# Patient Record
Sex: Female | Born: 1965 | Race: Black or African American | Hispanic: No | Marital: Married | Smoking: Current some day smoker
Health system: Southern US, Community
[De-identification: ages and names within clinical notes are randomized; demographics above are authoritative.]

## PROBLEM LIST (undated history)

## (undated) DIAGNOSIS — R569 Unspecified convulsions: Secondary | ICD-10-CM

## (undated) DIAGNOSIS — G5600 Carpal tunnel syndrome, unspecified upper limb: Secondary | ICD-10-CM

## (undated) HISTORY — PX: TUBAL LIGATION: SHX77

---

## 2004-02-01 ENCOUNTER — Emergency Department (HOSPITAL_COMMUNITY): Admission: EM | Admit: 2004-02-01 | Discharge: 2004-02-01 | Payer: Self-pay | Admitting: Emergency Medicine

## 2010-01-09 ENCOUNTER — Emergency Department (HOSPITAL_COMMUNITY): Admission: EM | Admit: 2010-01-09 | Discharge: 2010-01-09 | Payer: Self-pay | Admitting: Emergency Medicine

## 2011-07-04 ENCOUNTER — Emergency Department (HOSPITAL_COMMUNITY)
Admission: EM | Admit: 2011-07-04 | Discharge: 2011-07-04 | Disposition: A | Discharge status: Home / Self Care | Payer: Self-pay | Attending: Emergency Medicine | Admitting: Emergency Medicine

## 2011-07-04 ENCOUNTER — Encounter: Payer: Self-pay | Admitting: Emergency Medicine

## 2011-07-04 ENCOUNTER — Emergency Department (HOSPITAL_COMMUNITY): Payer: Self-pay

## 2011-07-04 DIAGNOSIS — M79643 Pain in unspecified hand: Secondary | ICD-10-CM

## 2011-07-04 DIAGNOSIS — M79609 Pain in unspecified limb: Secondary | ICD-10-CM | POA: Insufficient documentation

## 2011-07-04 MED ORDER — NAPROXEN 500 MG PO TABS: 500.0000 mg | ORAL_TABLET | Freq: Two times a day (BID) | ORAL | Status: AC

## 2011-07-04 NOTE — ED Notes (Signed)
C/o onset of left hand pain last p.m.  No injury and no obvious deformity.  Pain is along thumb, 2nd and 3rd digits and increases with any use of hand.

## 2011-07-04 NOTE — ED Provider Notes (Signed)
History   Scribed for Benny Lennert, MD, the patient was seen in room APA06/APA06. This chart was scribed by Clarita Crane. This patient's care was started at 8:36AM.   CSN: [id number] Arrival date & time: 07/04/2011  8:02 AM  Chief Complaint  Patient presents with  . Hand Pain   HPI Cheryl Peters is a 45 y.o. female who presents to the Emergency Department complaining of constant left hand pain localized to thenar eminence described as soreness with associated swelling onset yesterday and persistent since. Notes hand pain is aggravated with movement of left wrist and left hand. Patient denies repetitive use of hands. Patient is left hand dominant. Denies numbness, tingling, HA, fever.   HPI ELEMENTS: Location: left hand-thenar eminence  Onset: yesterday Duration: persistent since onset  Timing: constant  Quality: soreness  Modifying factors: movement of left wrist and left hand  Context:  as above  Associated symptoms: +swelling. Denies fever, HA, numbness, tingling.   PAST MEDICAL HISTORY:  History reviewed. No pertinent past medical history.  PAST SURGICAL HISTORY:  Past Surgical History  Procedure Date  . Tubal ligation     MEDICATIONS:  Previous Medications   ACETAMINOPHEN (TYLENOL) 500 MG TABLET    Take 1,000 mg by mouth every 6 (six) hours as needed. For pain      ALLERGIES:  Allergies as of 07/04/2011  . (No Known Allergies)     FAMILY HISTORY:  Family History  Problem Relation Age of Onset  . Hypertension Mother   . Hypertension Sister      SOCIAL HISTORY: History   Social History  . Marital Status: Single    Spouse Name: N/A    Number of Children: N/A  . Years of Education: N/A   Social History Main Topics  . Smoking status: Former Games developer  . Smokeless tobacco: None  . Alcohol Use: No  . Drug Use: No  . Sexually Active:    Other Topics Concern  . None   Social History Narrative  . None    OB History    Grav Para Term Preterm Abortions TAB  SAB Ect Mult Living   2 2 2       2      Review of Systems  Constitutional: Negative for fatigue.  HENT: Negative for neck pain and sinus pressure.   Eyes: Negative for visual disturbance.  Respiratory: Negative for cough and shortness of breath.   Cardiovascular: Negative for chest pain.  Gastrointestinal: Negative for abdominal pain.  Musculoskeletal: Negative for back pain.       Left hand pain. Left hand swelling.   Skin: Negative for rash.  Neurological: Negative for headaches.  Hematological: Negative.     Physical Exam  BP 157/83  Pulse 99  Temp 98 F (36.7 C)  Resp 20  Ht 5\' 4"  (1.626 m)  Wt 172 lb 1 oz (78.047 kg)  BMI 29.53 kg/m2  SpO2 98%  LMP 05/29/2011  Physical Exam  Nursing note and vitals reviewed. Constitutional: She is oriented to person, place, and time. She appears well-developed and well-nourished. No distress.  HENT:  Head: Normocephalic.  Eyes: EOM are normal.  Neck: Neck supple. No tracheal deviation present.  Cardiovascular: Normal rate.   Pulmonary/Chest: Effort normal.  Abdominal: She exhibits no distension.  Musculoskeletal: Normal range of motion.       Mild tenderness to left thenar eminence with moderate swelling.  Neurological: She is alert and oriented to person, place, and time.  Skin: Skin  is warm and dry.  Psychiatric: She has a normal mood and affect. Her behavior is normal.    ED Course  Procedures  OTHER DATA REVIEWED: Nursing notes, vital signs, and past medical records reviewed. Lab results reviewed and considered Imaging results reviewed and considered  DIAGNOSTIC STUDIES: Oxygen Saturation is 98% on room air, normal by my interpretation.    LABS / RADIOLOGY: Dg Hand Complete Left  07/04/2011  *RADIOLOGY REPORT*  Clinical Data: Pain and swelling  LEFT HAND - COMPLETE 3+ VIEW  Comparison: None.  Findings: Normal alignment.  No fracture.  Preserved joint spaces. No radiographic foreign body.  IMPRESSION: No acute  finding  Original Report Authenticated By: Judie Petit. Ruel Favors, M.D.   ED COURSE / COORDINATION OF CARE: Orders Placed This Encounter  Procedures  . DG Hand Complete Left     MDM            Carpal tunnel.  Thenar eminence inflamed   PLAN: Discharge Home The patient is to return the emergency department if there is any worsening of symptoms. I have reviewed the discharge instructions with the patient/family  CONDITION ON DISCHARGE: Stable   MEDICATIONS GIVEN IN THE E.D.  Medications  acetaminophen (TYLENOL) 500 MG tablet (not administered)  naproxen (NAPROSYN) 500 MG tablet (not administered)   The chart was scribed for me under my direct supervision.  I personally performed the history, physical, and medical decision making and all procedures in the evaluation of this patient.Benny Lennert, MD 07/04/11 [phone]

## 2011-07-04 NOTE — ED Notes (Signed)
Pt c/o l hand pain/swelling since yesterday, denies trauma.

## 2012-05-21 ENCOUNTER — Encounter (HOSPITAL_COMMUNITY): Payer: Self-pay | Admitting: *Deleted

## 2012-05-21 ENCOUNTER — Emergency Department (HOSPITAL_COMMUNITY)
Admission: EM | Admit: 2012-05-21 | Discharge: 2012-05-21 | Disposition: A | Discharge status: Home / Self Care | Payer: Self-pay | Attending: Emergency Medicine | Admitting: Emergency Medicine

## 2012-05-21 DIAGNOSIS — W57XXXA Bitten or stung by nonvenomous insect and other nonvenomous arthropods, initial encounter: Secondary | ICD-10-CM

## 2012-05-21 DIAGNOSIS — F17200 Nicotine dependence, unspecified, uncomplicated: Secondary | ICD-10-CM | POA: Insufficient documentation

## 2012-05-21 DIAGNOSIS — S90569A Insect bite (nonvenomous), unspecified ankle, initial encounter: Secondary | ICD-10-CM | POA: Insufficient documentation

## 2012-05-21 MED ORDER — PREDNISONE 20 MG PO TABS
60.0000 mg | ORAL_TABLET | Freq: Once | ORAL | Status: AC
  Administered 2012-05-21: 60 mg via ORAL
  Filled 2012-05-21: qty 1

## 2012-05-21 MED ORDER — FAMOTIDINE 20 MG PO TABS
20.0000 mg | ORAL_TABLET | Freq: Once | ORAL | Status: AC
  Administered 2012-05-21: 20 mg via ORAL
  Filled 2012-05-21: qty 1

## 2012-05-21 MED ORDER — PREDNISONE 50 MG PO TABS: ORAL_TABLET | ORAL | Status: AC

## 2012-05-21 MED ORDER — DIPHENHYDRAMINE HCL 25 MG PO CAPS
50.0000 mg | ORAL_CAPSULE | Freq: Once | ORAL | Status: AC
  Administered 2012-05-21: 50 mg via ORAL
  Filled 2012-05-21 (×2): qty 1

## 2012-05-21 NOTE — ED Provider Notes (Signed)
History     CSN: [id number]  Arrival date & time 05/21/12  1253   First MD Initiated Contact with Patient 05/21/12 1303      Chief Complaint  Patient presents with  . Rash    (Consider location/radiation/quality/duration/timing/severity/associated sxs/prior treatment) HPI Comments: Pt states she has been outside a lot lately.  Yesterday she noted that she was itching from her waist down and saw multiple red spots in same area.  No fever or chills.  The history is provided by the patient. No language interpreter was used.    History reviewed. No pertinent past medical history.  Past Surgical History  Procedure Date  . Tubal ligation     Family History  Problem Relation Age of Onset  . Hypertension Mother   . Hypertension Sister     History  Substance Use Topics  . Smoking status: Current Some Day Smoker  . Smokeless tobacco: Not on file  . Alcohol Use: No    OB History    Grav Para Term Preterm Abortions TAB SAB Ect Mult Living   2 2 2       2       Review of Systems  Constitutional: Negative for fever and chills.  Skin:       Skin lesions   All other systems reviewed and are negative.    Allergies  Review of patient's allergies indicates no known allergies.  Home Medications   Current Outpatient Rx  Name Route Sig Dispense Refill  . ACETAMINOPHEN 500 MG PO TABS Oral Take 1,000 mg by mouth every 6 (six) hours as needed. For pain     . NAPROXEN 500 MG PO TABS Oral Take 1 tablet (500 mg total) by mouth 2 (two) times daily. 30 tablet 0    BP 115/68  Pulse 87  Temp 97.7 F (36.5 C) (Oral)  Resp 17  Wt 189 lb (85.73 kg)  SpO2 98%  LMP 05/29/2011  Physical Exam  Nursing note and vitals reviewed. Constitutional: She is oriented to person, place, and time. She appears well-developed and well-nourished. No distress.  HENT:  Head: Normocephalic and atraumatic.  Eyes: EOM are normal.  Neck: Normal range of motion.  Cardiovascular: Normal rate,  regular rhythm and normal heart sounds.   Pulmonary/Chest: Effort normal and breath sounds normal.  Abdominal: Soft. She exhibits no distension. There is no tenderness.  Musculoskeletal: Normal range of motion.       ~ 50-60 distinct red lesions ~ 3-4 mm in diam scattered from waist to mid calves c/w insect bites/?chiggers.  Neurological: She is alert and oriented to person, place, and time.  Skin: Skin is warm and dry. No rash noted. There is erythema.  Psychiatric: She has a normal mood and affect. Judgment normal.    ED Course  Procedures (including critical care time)  Labs Reviewed - No data to display No results found.   1. Insect bites       MDM  rx-prednisone 50 mg, 6 Benadryl 50 mg QID pepcid 20 mg BID        Evalina Field, [place] 05/21/12 1402

## 2012-05-21 NOTE — ED Notes (Signed)
Itching rash to to thighs , arms and trunk since yesterday. Alert, no resp distress.

## 2012-05-21 NOTE — ED Notes (Signed)
Patient with no complaints at this time. Respirations even and unlabored. Skin warm/dry. Discharge instructions reviewed with patient at this time. Patient given opportunity to voice concerns/ask questions. Patient discharged at this time and left Emergency Department with steady gait.   

## 2012-05-22 NOTE — ED Provider Notes (Signed)
Medical screening examination/treatment/procedure(s) were performed by non-physician practitioner and as supervising physician I was immediately available for consultation/collaboration.  Massiah Minjares R. Kandis Henry, MD 05/22/12 1457 

## 2012-05-27 ENCOUNTER — Emergency Department (HOSPITAL_COMMUNITY)
Admission: EM | Admit: 2012-05-27 | Discharge: 2012-05-28 | Disposition: A | Discharge status: Home / Self Care | Payer: Self-pay | Attending: Emergency Medicine | Admitting: Emergency Medicine

## 2012-05-27 ENCOUNTER — Encounter (HOSPITAL_COMMUNITY): Payer: Self-pay

## 2012-05-27 DIAGNOSIS — F17200 Nicotine dependence, unspecified, uncomplicated: Secondary | ICD-10-CM | POA: Insufficient documentation

## 2012-05-27 DIAGNOSIS — R5381 Other malaise: Secondary | ICD-10-CM | POA: Insufficient documentation

## 2012-05-27 DIAGNOSIS — R569 Unspecified convulsions: Secondary | ICD-10-CM

## 2012-05-27 DIAGNOSIS — R5383 Other fatigue: Secondary | ICD-10-CM | POA: Insufficient documentation

## 2012-05-27 DIAGNOSIS — N390 Urinary tract infection, site not specified: Secondary | ICD-10-CM | POA: Insufficient documentation

## 2012-05-27 NOTE — ED Notes (Signed)
Patient states that she is feeling weak. House is hot, no air conditioning.

## 2012-05-28 LAB — CBC
HCT: 37.2 % (ref 36.0–46.0)
MCV: 83.8 fL (ref 78.0–100.0)
RBC: 4.44 MIL/uL (ref 3.87–5.11)
RDW: 15.3 % (ref 11.5–15.5)
WBC: 11.4 10*3/uL — ABNORMAL HIGH (ref 4.0–10.5)

## 2012-05-28 LAB — URINE MICROSCOPIC-ADD ON

## 2012-05-28 LAB — URINALYSIS, ROUTINE W REFLEX MICROSCOPIC
Nitrite: POSITIVE — AB
Specific Gravity, Urine: 1.025 (ref 1.005–1.030)
Urobilinogen, UA: 0.2 mg/dL (ref 0.0–1.0)
pH: 6 (ref 5.0–8.0)

## 2012-05-28 LAB — CK: Total CK: 146 U/L (ref 7–177)

## 2012-05-28 LAB — BASIC METABOLIC PANEL
BUN: 20 mg/dL (ref 6–23)
CO2: 27 mEq/L (ref 19–32)
Chloride: 101 mEq/L (ref 96–112)
GFR calc Af Amer: >90 mL/min (ref >90)
Potassium: 3.3 mEq/L — ABNORMAL LOW (ref 3.5–5.1)

## 2012-05-28 MED ORDER — NITROFURANTOIN MACROCRYSTAL 100 MG PO CAPS: ORAL_CAPSULE | ORAL | Status: DC

## 2012-05-28 MED ORDER — SODIUM CHLORIDE 0.9 % IV BOLUS (SEPSIS)
1000.0000 mL | Freq: Once | INTRAVENOUS | Status: AC
  Administered 2012-05-28: 1000 mL via INTRAVENOUS

## 2012-05-28 MED ORDER — SODIUM CHLORIDE 0.9 % IV SOLN
Freq: Once | INTRAVENOUS | Status: AC
  Administered 2012-05-28: 01:00:00 via INTRAVENOUS

## 2012-05-28 MED ORDER — CIPROFLOXACIN HCL 250 MG PO TABS
500.0000 mg | ORAL_TABLET | Freq: Once | ORAL | Status: AC
  Administered 2012-05-28: 500 mg via ORAL
  Filled 2012-05-28: qty 2

## 2012-05-28 NOTE — ED Provider Notes (Signed)
History     CSN: [id number]  Arrival date & time 05/27/12  2343   First MD Initiated Contact with Patient 05/28/12 0040      Chief Complaint  Patient presents with  . Fatigue    (Consider location/radiation/quality/duration/timing/severity/associated sxs/prior treatment) HPI  Cheryl Peters is a 46 y.o. female who presents to the Emergency Department complaining of generalized weakness as a result of being in a hot house. No air conditioning. Three fans. Per husband, patient had a seizure with body jerking and trickle of blood in the saliva. No h/o seizure disorder.  History reviewed. No pertinent past medical history.  Past Surgical History  Procedure Date  . Tubal ligation     Family History  Problem Relation Age of Onset  . Hypertension Mother   . Hypertension Sister     History  Substance Use Topics  . Smoking status: Current Some Day Smoker  . Smokeless tobacco: Not on file  . Alcohol Use: No    OB History    Grav Para Term Preterm Abortions TAB SAB Ect Mult Living   2 2 2       2       Review of Systems  Constitutional: Positive for fatigue. Negative for fever.       10 Systems reviewed and are negative for acute change except as noted in the HPI.  HENT: Negative for congestion.   Eyes: Negative for discharge and redness.  Respiratory: Negative for cough and shortness of breath.   Cardiovascular: Negative for chest pain.  Gastrointestinal: Negative for vomiting and abdominal pain.  Musculoskeletal: Negative for back pain.  Skin: Negative for rash.  Neurological: Positive for weakness. Negative for syncope, numbness and headaches.  Psychiatric/Behavioral:       No behavior change.    Allergies  Review of patient's allergies indicates no known allergies.  Home Medications   Current Outpatient Rx  Name Route Sig Dispense Refill  . PREDNISONE 50 MG PO TABS  One tab po QD 6 tablet 0  . ACETAMINOPHEN 500 MG PO TABS Oral Take 1,000 mg by mouth every 6  (six) hours as needed. For pain     . NAPROXEN 500 MG PO TABS Oral Take 1 tablet (500 mg total) by mouth 2 (two) times daily. 30 tablet 0    BP 137/84  Pulse 75  Temp 98.5 F (36.9 C) (Oral)  Resp 14  SpO2 94%  LMP 05/29/2011  Physical Exam  Nursing note and vitals reviewed. Constitutional: She is oriented to person, place, and time. She appears well-developed and well-nourished.       Awake, alert, nontoxic appearance.  HENT:  Head: Normocephalic and atraumatic.  Right Ear: External ear normal.  Left Ear: External ear normal.  Mouth/Throat: Oropharynx is clear and moist.       No tongue bruising or lacerations.  Eyes: Conjunctivae and EOM are normal. Pupils are equal, round, and reactive to light. Right eye exhibits no discharge. Left eye exhibits no discharge.  Neck: Normal range of motion. Neck supple.  Cardiovascular: Normal heart sounds.   Pulmonary/Chest: Effort normal and breath sounds normal. She exhibits no tenderness.  Abdominal: Soft. Bowel sounds are normal. There is no tenderness. There is no rebound.  Musculoskeletal: Normal range of motion. She exhibits no tenderness.       Baseline ROM, no obvious new focal weakness.  Neurological: She is alert and oriented to person, place, and time. She has normal reflexes. No cranial nerve deficit. Coordination  normal.       Mental status and motor strength appears baseline for patient and situation.  Skin: No rash noted.  Psychiatric: She has a normal mood and affect.    ED Course  Procedures (including critical care time) Results for orders placed during the hospital encounter of 05/27/12  CBC      Component Value Range   WBC 11.4 (*) 4.0 - 10.5 K/uL   RBC 4.44  3.87 - 5.11 MIL/uL   Hemoglobin 12.8  12.0 - 15.0 g/dL   HCT 09.8  11.9 - 14.7 %   MCV 83.8  78.0 - 100.0 fL   MCH 28.8  26.0 - 34.0 pg   MCHC 34.4  30.0 - 36.0 g/dL   RDW 82.9  56.2 - 13.0 %   Platelets 303  150 - 400 K/uL  BASIC METABOLIC PANEL       Component Value Range   Sodium 136  135 - 145 mEq/L   Potassium 3.3 (*) 3.5 - 5.1 mEq/L   Chloride 101  96 - 112 mEq/L   CO2 27  19 - 32 mEq/L   Glucose, Bld 95  70 - 99 mg/dL   BUN 20  6 - 23 mg/dL   Creatinine, Ser 8.65  0.50 - 1.10 mg/dL   Calcium 9.2  8.4 - 78.4 mg/dL   GFR calc non Af Amer >90  >90 mL/min   GFR calc Af Amer >90  >90 mL/min  CK      Component Value Range   Total CK 146  7 - 177 U/L  URINALYSIS, ROUTINE W REFLEX MICROSCOPIC      Component Value Range   Color, Urine YELLOW  YELLOW   APPearance HAZY (*) CLEAR   Specific Gravity, Urine 1.025  1.005 - 1.030   pH 6.0  5.0 - 8.0   Glucose, UA NEGATIVE  NEGATIVE mg/dL   Hgb urine dipstick MODERATE (*) NEGATIVE   Bilirubin Urine NEGATIVE  NEGATIVE   Ketones, ur NEGATIVE  NEGATIVE mg/dL   Protein, ur TRACE (*) NEGATIVE mg/dL   Urobilinogen, UA 0.2  0.0 - 1.0 mg/dL   Nitrite POSITIVE (*) NEGATIVE   Leukocytes, UA NEGATIVE  NEGATIVE  URINE MICROSCOPIC-ADD ON      Component Value Range   Squamous Epithelial / LPF MANY (*) RARE   WBC, UA 7-10  <3 WBC/hpf   RBC / HPF 0-2  <3 RBC/hpf   Bacteria, UA MANY (*) RARE      MDM  Patient with generalized weakness who may have had a seizure at home. Labs with UTI. Initiated antibiotics. Patient given IVF, has taken PO fluids. Pt stable in ED with no significant deterioration in condition.The patient appears reasonably screened and/or stabilized for discharge and I doubt any other medical condition or other [facility] requiring further screening, evaluation, or treatment in the ED at this time prior to discharge.  MDM Reviewed: nursing note and vitals           Nicoletta Dress. Colon Branch, MD 05/28/12 [phone]

## 2014-08-23 ENCOUNTER — Encounter (HOSPITAL_COMMUNITY): Payer: Self-pay | Admitting: Emergency Medicine

## 2014-08-23 ENCOUNTER — Emergency Department (HOSPITAL_COMMUNITY)
Admission: EM | Admit: 2014-08-23 | Discharge: 2014-08-23 | Disposition: A | Discharge status: Home / Self Care | Payer: Self-pay | Attending: Emergency Medicine | Admitting: Emergency Medicine

## 2014-08-23 DIAGNOSIS — M79642 Pain in left hand: Secondary | ICD-10-CM | POA: Insufficient documentation

## 2014-08-23 DIAGNOSIS — M25432 Effusion, left wrist: Secondary | ICD-10-CM | POA: Insufficient documentation

## 2014-08-23 DIAGNOSIS — M25532 Pain in left wrist: Secondary | ICD-10-CM

## 2014-08-23 DIAGNOSIS — Z720 Tobacco use: Secondary | ICD-10-CM | POA: Insufficient documentation

## 2014-08-23 MED ORDER — NAPROXEN SODIUM 275 MG PO TABS: ORAL_TABLET | ORAL | Status: DC

## 2014-08-23 MED ORDER — NAPROXEN 250 MG PO TABS
500.0000 mg | ORAL_TABLET | Freq: Once | ORAL | Status: AC
  Administered 2014-08-23: 500 mg via ORAL
  Filled 2014-08-23: qty 2

## 2014-08-23 MED ORDER — NAPROXEN 250 MG PO TABS
ORAL_TABLET | ORAL | Status: AC
  Filled 2014-08-23: qty 1

## 2014-08-23 MED ORDER — HYDROCODONE-ACETAMINOPHEN 5-325 MG PO TABS: 1.0000 | ORAL_TABLET | Freq: Four times a day (QID) | ORAL | Status: DC | PRN

## 2014-08-23 MED ORDER — HYDROCODONE-ACETAMINOPHEN 5-325 MG PO TABS
1.0000 | ORAL_TABLET | Freq: Once | ORAL | Status: AC
  Administered 2014-08-23: 1 via ORAL
  Filled 2014-08-23: qty 1

## 2014-08-23 NOTE — ED Provider Notes (Signed)
CSN: [id number]     Arrival date & time 08/23/14  0219 History   First MD Initiated Contact with Patient 08/23/14 0430     Chief Complaint  Patient presents with  . Hand Pain    left wrist  and hand pain with no injury noted.  fingers swollen since last night per patient     (Consider location/radiation/quality/duration/timing/severity/associated sxs/prior Treatment) HPI This is a 48 year old female who spent the last 2 days loading a lot of tobacco. Since yesterday she has had pain in her left mid volar wrist radiating to her fingers. The pain has worsened gradually overnight and is now severe. It is worse with movement or palpation. There is some associated swelling of the left wrist and hand. There is no deformity. She denies paresthesias.  History reviewed. No pertinent past medical history. Past Surgical History  Procedure Laterality Date  . Tubal ligation     Family History  Problem Relation Age of Onset  . Hypertension Mother   . Hypertension Sister    History  Substance Use Topics  . Smoking status: Current Some Day Smoker  . Smokeless tobacco: Not on file  . Alcohol Use: No   OB History   Grav Para Term Preterm Abortions TAB SAB Ect Mult Living   2 2 2       2      Review of Systems  All other systems reviewed and are negative.   Allergies  Review of patient's allergies indicates no known allergies.  Home Medications   Prior to Admission medications   Medication Sig Start Date End Date Taking? Authorizing Provider  acetaminophen (TYLENOL) 500 MG tablet Take 1,000 mg by mouth every 6 (six) hours as needed. For pain     Historical Provider, MD  nitrofurantoin (MACRODANTIN) 100 MG capsule Take one pill twice a day for 5 days 05/28/12   Annamarie Dawleyerry S Strand, MD   BP 137/87  Pulse 98  Temp(Src) 98.3 F (36.8 C) (Oral)  Resp 20  Ht 5\' 4"  (1.626 m)  Wt 189 lb (85.73 kg)  BMI 32.43 kg/m2  SpO2 96%  LMP 05/29/2011  Physical Exam General: Well-developed,  well-nourished female in no acute distress; appearance consistent with age of record HENT: normocephalic; atraumatic Eyes: pupils equal, round and reactive to light; extraocular muscles intact Neck: supple Heart: regular rate and rhythm Lungs: clear to auscultation bilaterally Abdomen: soft; nondistended Extremities: No deformity; full range of motion except left wrist; left wrist with significant tenderness over the carpal tunnel with mild swelling and decreased range of the left wrist and hand, no warmth or erythema to suggest infection  Neurologic: Awake, alert and oriented; motor function intact in all extremities and symmetric; no facial droop Skin: Warm and dry Psychiatric: Tearful    ED Course  Procedures (including critical care time)  MDM  The patient has positive nose and Phalen's sign on the left but has no paresthesias, only pain. This may represent carpal tunnel syndrome or could be tendinitis. We will splint and treat with anti-inflammatories and refer to orthopedics.   Hanley SeamenJohn L Daxton Nydam, MD 08/23/14 [phone]

## 2014-08-23 NOTE — ED Notes (Signed)
[facility] pharmacy called and reported they do not have the strength of naproxen ordered.  Spoke with Dr. Adriana Simasook and he changed prescription to 500mg  po bid dispense 20.

## 2014-09-12 ENCOUNTER — Encounter (HOSPITAL_COMMUNITY): Payer: Self-pay | Admitting: Emergency Medicine

## 2014-12-18 ENCOUNTER — Emergency Department: Payer: Self-pay | Admitting: Emergency Medicine

## 2014-12-18 LAB — COMPREHENSIVE METABOLIC PANEL
ANION GAP: 8 (ref 7–16)
Albumin: 3.7 g/dL (ref 3.4–5.0)
Alkaline Phosphatase: 83 U/L (ref 46–116)
BUN: 14 mg/dL (ref 7–18)
Bilirubin,Total: 0.6 mg/dL (ref 0.2–1.0)
CALCIUM: 9.4 mg/dL (ref 8.5–10.1)
CO2: 29 mmol/L (ref 21–32)
Chloride: 102 mmol/L (ref 98–107)
Creatinine: 0.96 mg/dL (ref 0.60–1.30)
Glucose: 135 mg/dL — ABNORMAL HIGH (ref 65–99)
Osmolality: 280 (ref 275–301)
POTASSIUM: 2.8 mmol/L — AB (ref 3.5–5.1)
SGOT(AST): 49 U/L — ABNORMAL HIGH (ref 15–37)
SGPT (ALT): 30 U/L (ref 14–63)
Sodium: 139 mmol/L (ref 136–145)
Total Protein: 8.2 g/dL (ref 6.4–8.2)

## 2014-12-18 LAB — URINALYSIS, COMPLETE
BILIRUBIN, UR: NEGATIVE
Glucose,UR: NEGATIVE mg/dL (ref 0–75)
KETONE: NEGATIVE
Leukocyte Esterase: NEGATIVE
Nitrite: POSITIVE
PH: 6 (ref 4.5–8.0)
Protein: 30
Specific Gravity: 1.015 (ref 1.003–1.030)
WBC UR: 3 /HPF (ref 0–5)

## 2014-12-18 LAB — CBC
HCT: 41.9 % (ref 35.0–47.0)
HGB: 13.7 g/dL (ref 12.0–16.0)
MCH: 28 pg (ref 26.0–34.0)
MCHC: 32.7 g/dL (ref 32.0–36.0)
MCV: 86 fL (ref 80–100)
PLATELETS: 316 10*3/uL (ref 150–440)
RBC: 4.89 10*6/uL (ref 3.80–5.20)
RDW: 14 % (ref 11.5–14.5)
WBC: 11.4 10*3/uL — AB (ref 3.6–11.0)

## 2014-12-18 LAB — DRUG SCREEN, URINE
AMPHETAMINES, UR SCREEN: NEGATIVE (ref ?–1000)
Barbiturates, Ur Screen: NEGATIVE (ref ?–200)
Benzodiazepine, Ur Scrn: NEGATIVE (ref ?–200)
CANNABINOID 50 NG, UR ~~LOC~~: POSITIVE (ref ?–50)
COCAINE METABOLITE, UR ~~LOC~~: NEGATIVE (ref ?–300)
MDMA (ECSTASY) UR SCREEN: NEGATIVE (ref ?–500)
Methadone, Ur Screen: NEGATIVE (ref ?–300)
Opiate, Ur Screen: NEGATIVE (ref ?–300)
Phencyclidine (PCP) Ur S: NEGATIVE (ref ?–25)
Tricyclic, Ur Screen: NEGATIVE (ref ?–1000)

## 2014-12-18 LAB — ETHANOL

## 2014-12-19 LAB — URINE CULTURE

## 2015-01-26 ENCOUNTER — Other Ambulatory Visit (HOSPITAL_COMMUNITY): Payer: Self-pay | Admitting: Family Medicine

## 2015-01-26 ENCOUNTER — Ambulatory Visit (HOSPITAL_COMMUNITY)
Admission: RE | Admit: 2015-01-26 | Discharge: 2015-01-26 | Disposition: A | Discharge status: Home / Self Care | Payer: Disability Insurance | Source: Ambulatory Visit | Attending: Family Medicine | Admitting: Family Medicine

## 2015-01-26 DIAGNOSIS — M2578 Osteophyte, vertebrae: Secondary | ICD-10-CM | POA: Insufficient documentation

## 2015-01-26 DIAGNOSIS — M545 Low back pain: Secondary | ICD-10-CM

## 2015-02-18 ENCOUNTER — Encounter (HOSPITAL_COMMUNITY): Payer: Self-pay | Admitting: Emergency Medicine

## 2015-02-18 ENCOUNTER — Emergency Department (HOSPITAL_COMMUNITY)
Admission: EM | Admit: 2015-02-18 | Discharge: 2015-02-18 | Disposition: A | Discharge status: Home / Self Care | Payer: Self-pay | Attending: Emergency Medicine | Admitting: Emergency Medicine

## 2015-02-18 ENCOUNTER — Emergency Department (HOSPITAL_COMMUNITY): Payer: Self-pay

## 2015-02-18 DIAGNOSIS — Z8669 Personal history of other diseases of the nervous system and sense organs: Secondary | ICD-10-CM | POA: Insufficient documentation

## 2015-02-18 DIAGNOSIS — F1210 Cannabis abuse, uncomplicated: Secondary | ICD-10-CM | POA: Insufficient documentation

## 2015-02-18 DIAGNOSIS — Z720 Tobacco use: Secondary | ICD-10-CM | POA: Insufficient documentation

## 2015-02-18 DIAGNOSIS — R569 Unspecified convulsions: Secondary | ICD-10-CM | POA: Insufficient documentation

## 2015-02-18 HISTORY — DX: Carpal tunnel syndrome, unspecified upper limb: G56.00

## 2015-02-18 LAB — CBC WITH DIFFERENTIAL/PLATELET
BASOS ABS: 0.1 10*3/uL (ref 0.0–0.1)
BASOS PCT: 1 % (ref 0–1)
EOS PCT: 0 % (ref 0–5)
Eosinophils Absolute: 0 10*3/uL (ref 0.0–0.7)
HCT: 39.7 % (ref 36.0–46.0)
HEMOGLOBIN: 13.5 g/dL (ref 12.0–15.0)
LYMPHS PCT: 10 % — AB (ref 12–46)
Lymphs Abs: 1 10*3/uL (ref 0.7–4.0)
MCH: 29 pg (ref 26.0–34.0)
MCHC: 34 g/dL (ref 30.0–36.0)
MCV: 85.2 fL (ref 78.0–100.0)
MONO ABS: 0.4 10*3/uL (ref 0.1–1.0)
Monocytes Relative: 4 % (ref 3–12)
Neutro Abs: 8.5 10*3/uL — ABNORMAL HIGH (ref 1.7–7.7)
Neutrophils Relative %: 85 % — ABNORMAL HIGH (ref 43–77)
Platelets: 342 10*3/uL (ref 150–400)
RBC: 4.66 MIL/uL (ref 3.87–5.11)
RDW: 14.9 % (ref 11.5–15.5)
WBC: 10.1 10*3/uL (ref 4.0–10.5)

## 2015-02-18 LAB — COMPREHENSIVE METABOLIC PANEL
ALT: 16 U/L (ref 0–35)
ANION GAP: 8 (ref 5–15)
AST: 21 U/L (ref 0–37)
Albumin: 4 g/dL (ref 3.5–5.2)
Alkaline Phosphatase: 78 U/L (ref 39–117)
BILIRUBIN TOTAL: 0.3 mg/dL (ref 0.3–1.2)
BUN: 16 mg/dL (ref 6–23)
CHLORIDE: 101 mmol/L (ref 96–112)
CO2: 27 mmol/L (ref 19–32)
CREATININE: 0.81 mg/dL (ref 0.50–1.10)
Calcium: 9.5 mg/dL (ref 8.4–10.5)
GFR calc non Af Amer: 83 mL/min — ABNORMAL LOW (ref >90)
Glucose, Bld: 103 mg/dL — ABNORMAL HIGH (ref 70–99)
Potassium: 3.6 mmol/L (ref 3.5–5.1)
SODIUM: 136 mmol/L (ref 135–145)
Total Protein: 7.9 g/dL (ref 6.0–8.3)

## 2015-02-18 LAB — RAPID URINE DRUG SCREEN, HOSP PERFORMED
Amphetamines: NOT DETECTED
BENZODIAZEPINES: NOT DETECTED
Barbiturates: NOT DETECTED
Cocaine: NOT DETECTED
OPIATES: NOT DETECTED
Tetrahydrocannabinol: POSITIVE — AB

## 2015-02-18 LAB — ETHANOL

## 2015-02-18 MED ORDER — PHENYTOIN SODIUM EXTENDED 100 MG PO CAPS: 300.0000 mg | ORAL_CAPSULE | Freq: Every day | ORAL | Status: DC

## 2015-02-18 MED ORDER — SODIUM CHLORIDE 0.9 % IV SOLN
1000.0000 mg | Freq: Once | INTRAVENOUS | Status: AC
  Administered 2015-02-18: 1000 mg via INTRAVENOUS
  Filled 2015-02-18: qty 20

## 2015-02-18 NOTE — ED Provider Notes (Signed)
CSN: [id number]     Arrival date & time 02/18/15  [id number] History  This chart was scribed for Bethann BerkshireJoseph Carlson Belland, MD by Jarvis Morganaylor Ferguson, ED Scribe. This patient was seen in room APA14/APA14 and the patient's care was started at 8:53 AM.    Chief Complaint  Patient presents with  . Seizures    Patient is a 49 y.o. female presenting with seizures. The history is provided by the spouse and the patient.  Seizures Seizure activity on arrival: no   Preceding symptoms: no dizziness, no headache, no nausea and no vision change   Episode characteristics: generalized shaking and incontinence (very mild)   Postictal symptoms: no confusion, no memory loss and no somnolence   Return to baseline: yes   Duration:  20 minutes Timing: episodic. Number of seizures this episode:  1 Progression:  Resolved Context: not alcohol withdrawal   Recent head injury:  No recent head injuries PTA treatment:  None History of seizures: no     HPI Comments: Cheryl Peters is a 49 y.o. female brought in by EMS who presents to the Emergency Department complaining of a seizure that happened PTA. Pt's husband states she was lying in bed and began shaking and "her mouth drooped off to the side". Pt denies any preceding symptoms. Pt's husbands believes it lasted roughly 20 minutes. Pt's husband states she had very mild urinary incontinence during the episode. Pt denies biting her tongue during the episode. Pt's family reports she has had episodes like this before but no seizure diagnosis. She has followed up with a neurologist for this but she is trying to get her insurance worked out. Pt states she feels fine now. She notes an initial HA but it has now resolved. She does not remember the seizure episode. Pt denies ETOH use. She denies any pain or any other complaints at this time.  Past Medical History  Diagnosis Date  . Carpal tunnel syndrome    Past Surgical History  Procedure Laterality Date  . Tubal ligation     Family History   Problem Relation Age of Onset  . Hypertension Mother   . Hypertension Sister    History  Substance Use Topics  . Smoking status: Current Some Day Smoker -- 0.50 packs/day for 35 years    Types: Cigarettes  . Smokeless tobacco: Former NeurosurgeonUser  . Alcohol Use: No   OB History    Gravida Para Term Preterm AB TAB SAB Ectopic Multiple Living   2 2 2       2      Review of Systems  Constitutional: Negative for appetite change and fatigue.  HENT: Negative for congestion, ear discharge and sinus pressure.   Eyes: Negative for discharge.  Respiratory: Negative for cough.   Cardiovascular: Negative for chest pain.  Gastrointestinal: Negative for abdominal pain and diarrhea.  Genitourinary: Negative for frequency and hematuria.  Musculoskeletal: Negative for back pain.  Skin: Negative for rash.  Neurological: Positive for seizures and headaches (after episode but now resolved).  Psychiatric/Behavioral: Negative for hallucinations.      Allergies  Review of patient's allergies indicates no known allergies.  Home Medications   Prior to Admission medications   Medication Sig Start Date End Date Taking? Authorizing Provider  HYDROcodone-acetaminophen (NORCO) 5-325 MG per tablet Take 1-2 tablets by mouth every 6 (six) hours as needed (for pain). Patient not taking: Reported on 02/18/2015 08/23/14   Paula LibraJohn Molpus, MD  naproxen sodium (ANAPROX) 275 MG tablet Take one tablet twice  daily while wrist is painful. Patient not taking: Reported on 02/18/2015 08/23/14   Paula Libra, MD  nitrofurantoin (MACRODANTIN) 100 MG capsule Take one pill twice a day for 5 days Patient not taking: Reported on 02/18/2015 05/28/12   Annamarie Dawley, MD   Triage Vitals: BP 135/86 mmHg  Pulse 111  Temp(Src) 98.4 F (36.9 C) (Oral)  Resp 16  Ht  (1.676 m)  Wt 189 lb (85.73 kg)  BMI 30.52 kg/m2  SpO2 98%  LMP 05/29/2011   Physical Exam  Constitutional: She is oriented to person, place, and time. She appears  well-developed.  HENT:  Head: Normocephalic.  Eyes: Conjunctivae and EOM are normal. No scleral icterus.  Neck: Neck supple. No thyromegaly present.  Cardiovascular: Normal rate and regular rhythm.  Exam reveals no gallop and no friction rub.   No murmur heard. Pulmonary/Chest: No stridor. She has no wheezes. She has no rales. She exhibits no tenderness.  Abdominal: She exhibits no distension. There is no tenderness. There is no rebound.  Musculoskeletal: Normal range of motion. She exhibits no edema.  Lymphadenopathy:    She has no cervical adenopathy.  Neurological: She is oriented to person, place, and time. She exhibits normal muscle tone. Coordination normal.  Skin: No rash noted. No erythema.  Psychiatric: She has a normal mood and affect. Her behavior is normal.    ED Course  Procedures (including critical care time)   DIAGNOSTIC STUDIES: Oxygen Saturation is 98% on RA, normal by my interpretation.    COORDINATION OF CARE: 9:01 AM- Will order Head CT w/o contrast, diagnostic lab work, IV fluids and 12-Lead EKG.  Pt advised of plan for treatment and pt agrees.   Labs Review Labs Reviewed  CBC WITH DIFFERENTIAL/PLATELET - Abnormal; Notable for the following:    Neutrophils Relative % 85 (*)    Neutro Abs 8.5 (*)    Lymphocytes Relative 10 (*)    All other components within normal limits  COMPREHENSIVE METABOLIC PANEL - Abnormal; Notable for the following:    Glucose, Bld 103 (*)    GFR calc non Af Amer 83 (*)    All other components within normal limits  URINE RAPID DRUG SCREEN (HOSP PERFORMED) - Abnormal; Notable for the following:    Tetrahydrocannabinol POSITIVE (*)    All other components within normal limits  ETHANOL    Imaging Review Ct Head Wo Contrast  02/18/2015   CLINICAL DATA:  Seizure-like activity this morning. No significant past mental history.  EXAM: CT HEAD WITHOUT CONTRAST  TECHNIQUE: Contiguous axial images were obtained from the base of the skull  through the vertex without intravenous contrast.  COMPARISON:  12/18/2014  FINDINGS: Gray-white differentiation is maintained. No CT evidence of acute large territory infarct. No intraparenchymal or extra-axial mass or hemorrhage. Normal size and configuration of the ventricles and basilar cisterns. No midline shift. There is underpneumatization of the bilateral frontal sinuses. The remaining paranasal sinuses and mastoid air cells are normally aerated. No air-fluid levels. Regional soft tissues appear normal. No displaced calvarial fracture.  IMPRESSION: Negative noncontrast head CT.   Electronically Signed   By: Simonne Come M.D.   On: 02/18/2015 10:18     EKG Interpretation None      MDM   Final diagnoses:  None    Sz,  tx with dilantin and neuro follow up   The chart was scribed for me under my direct supervision.  I personally performed the history, physical, and medical decision making  and all procedures in the evaluation of this patient.Bethann Berkshire, MD 02/18/15 [phone]

## 2015-02-18 NOTE — ED Notes (Signed)
Pt assisted to restroom, tolerated well,  

## 2015-02-18 NOTE — ED Notes (Signed)
Pt and family notified of medication order,

## 2015-02-18 NOTE — Discharge Instructions (Signed)
Follow up with dr. Gerilyn Pilgrimdoonquah next week.

## 2015-02-18 NOTE — ED Notes (Signed)
Received report on pt, pt lying in bed, family at bedside, no distress noted, pt denies any complaints, update given to pt and family member,

## 2015-02-18 NOTE — ED Notes (Signed)
Pharmacy notified of dilantin order,

## 2015-02-18 NOTE — ED Notes (Signed)
Patient brought in via EMS from home. Alert and oriented. Airway patent. No acute distress noted. Per EMS called out for seizure. Patient has does not have hx of seizures but reports other "similiar episodes" in which she is supposed to she doctor about. EMS called by husband who stated that patient was shaking while laying in bed. Patient does not recall episode and was unsure of why EMS was there. EMS personal denies patient being postictal. Patient denies any pain.

## 2015-02-22 ENCOUNTER — Other Ambulatory Visit (HOSPITAL_COMMUNITY): Payer: Self-pay | Admitting: Physician Assistant

## 2015-02-22 DIAGNOSIS — Z1231 Encounter for screening mammogram for malignant neoplasm of breast: Secondary | ICD-10-CM

## 2015-03-23 ENCOUNTER — Ambulatory Visit (HOSPITAL_COMMUNITY): Payer: Self-pay

## 2015-03-27 ENCOUNTER — Encounter (HOSPITAL_COMMUNITY): Payer: Self-pay | Admitting: Emergency Medicine

## 2015-03-27 ENCOUNTER — Emergency Department (HOSPITAL_COMMUNITY)
Admission: EM | Admit: 2015-03-27 | Discharge: 2015-03-27 | Disposition: A | Discharge status: Home / Self Care | Payer: Self-pay | Attending: Emergency Medicine | Admitting: Emergency Medicine

## 2015-03-27 DIAGNOSIS — Z720 Tobacco use: Secondary | ICD-10-CM | POA: Insufficient documentation

## 2015-03-27 DIAGNOSIS — Z79899 Other long term (current) drug therapy: Secondary | ICD-10-CM | POA: Insufficient documentation

## 2015-03-27 DIAGNOSIS — Z760 Encounter for issue of repeat prescription: Secondary | ICD-10-CM | POA: Insufficient documentation

## 2015-03-27 DIAGNOSIS — G40909 Epilepsy, unspecified, not intractable, without status epilepticus: Secondary | ICD-10-CM | POA: Insufficient documentation

## 2015-03-27 DIAGNOSIS — Z8669 Personal history of other diseases of the nervous system and sense organs: Secondary | ICD-10-CM | POA: Insufficient documentation

## 2015-03-27 HISTORY — DX: Unspecified convulsions: R56.9

## 2015-03-27 MED ORDER — PHENYTOIN SODIUM EXTENDED 100 MG PO CAPS: 300.0000 mg | ORAL_CAPSULE | Freq: Every day | ORAL | Status: DC

## 2015-03-27 NOTE — ED Notes (Signed)
Pt states that she needs her Dilantin refilled.  Denies other complaints.

## 2015-03-27 NOTE — ED Provider Notes (Signed)
CSN: [id number]     Arrival date & time 03/27/15  1522 History  This chart was scribed for Cheryl Ausammy Cheryl Turri, PA-C, working with Cheryl Nayobert Beaton, MD by Cheryl Peters, ED Scribe. The patient was seen in APFT24/APFT24. The patient's care was started at 3:58 PM.     Chief Complaint  Patient presents with  . Medication Refill   HPI HPI Comments: Cheryl Peters is a 49 y.o. female with a history of seizures who presents to the Emergency Department for a medication refill on Dilantin. Patient reports that takes three 100 mg capsules daily.  She states that she ran out of her medication three days ago. Patient reports that she [facility], but they were unable to prescribe her medication for some reason.  The patient and her husband state that the clinic told them to come to the ED for a refill of the Dilantin.  Patient denies numbness, weakness, or seizures today.  States she feels "fine"   Past Medical History  Diagnosis Date  . Carpal tunnel syndrome   . Seizures    Past Surgical History  Procedure Laterality Date  . Tubal ligation     Family History  Problem Relation Age of Onset  . Hypertension Mother   . Hypertension Sister    History  Substance Use Topics  . Smoking status: Current Some Day Smoker -- 0.50 packs/day for 35 years    Types: Cigarettes  . Smokeless tobacco: Former NeurosurgeonUser  . Alcohol Use: No   OB History    Gravida Para Term Preterm AB TAB SAB Ectopic Multiple Living   2 2 2       2      Review of Systems  Neurological: Negative for dizziness, seizures, syncope, speech difficulty, weakness, numbness and headaches.  All other systems reviewed and are negative.     Allergies  Review of patient's allergies indicates no known allergies.  Home Medications   Prior to Admission medications   Medication Sig Start Date End Date Taking? Authorizing Provider  HYDROcodone-acetaminophen (NORCO) 5-325 MG per tablet Take 1-2 tablets  by mouth every 6 (six) hours as needed (for pain). Patient not taking: Reported on 02/18/2015 08/23/14   Cheryl LibraJohn Molpus, MD  naproxen sodium (ANAPROX) 275 MG tablet Take one tablet twice daily while wrist is painful. Patient not taking: Reported on 02/18/2015 08/23/14   Cheryl LibraJohn Molpus, MD  nitrofurantoin (MACRODANTIN) 100 MG capsule Take one pill twice a day for 5 days Patient not taking: Reported on 02/18/2015 05/28/12   Cheryl Dawleyerry S Strand, MD  phenytoin (DILANTIN) 100 MG ER capsule Take 3 capsules (300 mg total) by mouth at bedtime. 02/18/15   Cheryl BerkshireJoseph Zammit, MD   Triage Vitals: BP 111/81 mmHg  Pulse 117  Temp(Src) 97.9 F (36.6 C) (Oral)  Resp 20  Ht 5\' 4"  (1.626 m)  Wt 163 lb (73.936 kg)  BMI 27.97 kg/m2  SpO2 99%  LMP 05/29/2011 Physical Exam  Constitutional: She is oriented to person, place, and time. She appears well-developed and well-nourished. No distress.  HENT:  Head: Normocephalic and atraumatic.  Mouth/Throat: Oropharynx is clear and moist.  Eyes: Conjunctivae and EOM are normal. Pupils are equal, round, and reactive to light.  Neck: Neck supple. No JVD present. No thyromegaly present.  Cardiovascular: Normal rate, regular rhythm, normal heart sounds and intact distal pulses.   No murmur heard. Pulmonary/Chest: Effort normal and breath sounds normal. No respiratory distress.  Musculoskeletal: Normal range of motion.  Neurological: She is alert and oriented to person, place, and time. She exhibits normal muscle tone. Coordination normal.  Skin: Skin is warm and dry.  Psychiatric: She has a normal mood and affect. Her behavior is normal.  Nursing note and vitals reviewed.   ED Course  Procedures (including critical care time) DIAGNOSTIC STUDIES: Oxygen Saturation is 99% on room air, normal by my interpretation.    COORDINATION OF CARE:    Labs Review Labs Reviewed - No data to display  Imaging Review No results found.   EKG Interpretation None      MDM   Final  diagnoses:  Encounter for medication refill    Pt is well appearing.  Vitals stable.  Denies any symptoms at present.  States she was informed by someone at the free clinic to come to ED for her medication refill, that they could not fill it at this time.  Explained to the patient that I will refill this time as a courtesy, but she will need to arrange routine medication refills with her PMD.    I personally performed the services described in this documentation, which was scribed in my presence. The recorded information has been reviewed and is accurate.    Cheryl Ausammy [name], PA-C 03/29/15 [id number]  Cheryl Nayobert Beaton, MD 03/30/15 [phone]

## 2015-03-27 NOTE — Discharge Instructions (Signed)
Medication Refill, Emergency Department °We have refilled your medication today as a courtesy to you. It is best for your medical care, however, to take care of getting refills done through your primary caregiver's office. They have your records and can do a better job of follow-up than we can in the emergency department. °On maintenance medications, we often only prescribe enough medications to get you by until you are able to see your regular caregiver. This is a more expensive way to refill medications. °In the future, please plan for refills so that you will not have to use the emergency department for this. °Thank you for your help. Your help allows us to better take care of the daily emergencies that enter our department. °Document Released: 02/14/2004 Document Revised: 01/20/2012 Document Reviewed: 02/04/2014 °[employer]® Patient Information ©2015 [employer]. This information is not intended to replace advice given to you by your health care provider. Make sure you discuss any questions you have with your health care provider. ° °

## 2015-03-30 ENCOUNTER — Ambulatory Visit (HOSPITAL_COMMUNITY)
Admission: RE | Admit: 2015-03-30 | Discharge: 2015-03-30 | Disposition: A | Discharge status: Home / Self Care | Payer: Self-pay | Source: Ambulatory Visit | Attending: Physician Assistant | Admitting: Physician Assistant

## 2015-03-30 DIAGNOSIS — Z1231 Encounter for screening mammogram for malignant neoplasm of breast: Secondary | ICD-10-CM

## 2015-04-26 ENCOUNTER — Encounter (HOSPITAL_COMMUNITY): Payer: Self-pay | Admitting: Emergency Medicine

## 2015-04-26 ENCOUNTER — Emergency Department (HOSPITAL_COMMUNITY)
Admission: EM | Admit: 2015-04-26 | Discharge: 2015-04-26 | Disposition: A | Discharge status: Home / Self Care | Payer: Self-pay | Attending: Emergency Medicine | Admitting: Emergency Medicine

## 2015-04-26 DIAGNOSIS — Z760 Encounter for issue of repeat prescription: Secondary | ICD-10-CM | POA: Insufficient documentation

## 2015-04-26 DIAGNOSIS — G40909 Epilepsy, unspecified, not intractable, without status epilepticus: Secondary | ICD-10-CM | POA: Insufficient documentation

## 2015-04-26 DIAGNOSIS — Z720 Tobacco use: Secondary | ICD-10-CM | POA: Insufficient documentation

## 2015-04-26 DIAGNOSIS — Z79899 Other long term (current) drug therapy: Secondary | ICD-10-CM | POA: Insufficient documentation

## 2015-04-26 MED ORDER — PHENYTOIN SODIUM EXTENDED 100 MG PO CAPS: 300.0000 mg | ORAL_CAPSULE | Freq: Every day | ORAL | Status: DC

## 2015-04-26 NOTE — Clinical Social Work Note (Signed)
CSW received consult for medication assistance. CM notified. CSW will sign off, but can be reconsulted if needed.  Jarell Mcewen, LCSW [phone] 

## 2015-04-26 NOTE — ED Provider Notes (Signed)
CSN: [id number]     Arrival date & time 04/26/15  4098 History   First MD Initiated Contact with Patient 04/26/15 0755     Chief Complaint  Patient presents with  . Medication Refill      HPI Pt was seen at 0800. Per pt: States she came to the ED today for a refill of her seizure medication. Pt states she has one more dose of dilantin left and needs a new prescription. Pt states the "[facility] clinic won't see me." There are no other complaints. The patient has a significant history of similar visits previously.   Past Medical History  Diagnosis Date  . Carpal tunnel syndrome   . Seizures    Past Surgical History  Procedure Laterality Date  . Tubal ligation     Family History  Problem Relation Age of Onset  . Hypertension Mother   . Hypertension Sister    History  Substance Use Topics  . Smoking status: Current Some Day Smoker -- 0.50 packs/day for 35 years    Types: Cigarettes  . Smokeless tobacco: Former Neurosurgeon  . Alcohol Use: No   OB History    Gravida Para Term Preterm AB TAB SAB Ectopic Multiple Living   Review of Systems ROS: Statement: All systems negative except as marked or noted in the HPI; Constitutional: Negative for fever and chills. ; ; Eyes: Negative for eye pain, redness and discharge. ; ; ENMT: Negative for ear pain, hoarseness, nasal congestion, sinus pressure and sore throat. ; ; Cardiovascular: Negative for chest pain, palpitations, diaphoresis, dyspnea and peripheral edema. ; ; Respiratory: Negative for cough, wheezing and stridor. ; ; Gastrointestinal: Negative for nausea, vomiting, diarrhea, abdominal pain, blood in stool, hematemesis, jaundice and rectal bleeding. . ; ; Genitourinary: Negative for dysuria, flank pain and hematuria. ; ; Musculoskeletal: Negative for back pain and neck pain. Negative for swelling and trauma.; ; Skin: Negative for pruritus, rash, abrasions, blisters, bruising and skin lesion.; ; Neuro: Negative for headache,  lightheadedness and neck stiffness. Negative for weakness, altered level of consciousness , altered mental status, extremity weakness, paresthesias, involuntary movement, seizure and syncope.      Allergies  Review of patient's allergies indicates no known allergies.  Home Medications   Prior to Admission medications   Medication Sig Start Date End Date Taking? Authorizing Provider  HYDROcodone-acetaminophen (NORCO) 5-325 MG per tablet Take 1-2 tablets by mouth every 6 (six) hours as needed (for pain). Patient not taking: Reported on 02/18/2015 08/23/14   Paula Libra, MD  naproxen sodium (ANAPROX) 275 MG tablet Take one tablet twice daily while wrist is painful. Patient not taking: Reported on 02/18/2015 08/23/14   Paula Libra, MD  nitrofurantoin (MACRODANTIN) 100 MG capsule Take one pill twice a day for 5 days Patient not taking: Reported on 02/18/2015 05/28/12   Annamarie Dawley, MD  phenytoin (DILANTIN) 100 MG ER capsule Take 3 capsules (300 mg total) by mouth at bedtime. 03/27/15   Tammy Triplett, PA-C   BP 112/78 mmHg  Pulse 91  Temp(Src) 97.9 F (36.6 C)  Resp 18  Ht  (1.626 m)  Wt 163 lb (73.936 kg)  BMI 27.97 kg/m2  SpO2 96%  LMP 05/29/2011 Physical Exam 0805: Physical examination:  Nursing notes reviewed; Vital signs and O2 SAT reviewed;  Constitutional: Well developed, Well nourished, Well hydrated, In no acute distress; Head:  Normocephalic, atraumatic; Eyes: EOMI, PERRL, No  scleral icterus; ENMT: Mouth and pharynx normal, Mucous membranes moist; Neck: Supple, Full range of motion, No lymphadenopathy; Cardiovascular: Regular rate and rhythm, No gallop; Respiratory: Breath sounds clear & equal bilaterally, No rales, rhonchi, wheezes.  Speaking full sentences with ease, Normal respiratory effort/excursion; Chest: Nontender, Movement normal; Abdomen: Soft, Nontender, Nondistended, Normal bowel sounds; Genitourinary: No CVA tenderness; Extremities: Pulses normal, No tenderness, No  edema, No calf edema or asymmetry.; Neuro: AA&Ox3, Major CN grossly intact. No facial droop. Speech clear. No gross focal motor or sensory deficits in extremities. Climbs on and off stretcher easily by herself. Gait steady.; Skin: Color normal, Warm, Dry.   ED Course  Procedures     EKG Interpretation None      MDM  MDM Reviewed: previous chart, nursing note and vitals      0840:  Will refill dilantin today. SW has spoken with pt regarding future meds refills. Pt encouraged to f/u with her PMD for good continuity of care and control of her chronic medical conditions.  Verb understanding.    Samuel Jester, DO 04/27/15 1539

## 2015-04-26 NOTE — Discharge Instructions (Signed)
°Emergency Department Resource Guide °1) Find a Doctor and Pay Out of Pocket °Although you won't have to find out who is covered by your insurance plan, it is a good idea to ask around and get recommendations. You will then need to call the office and see if the doctor you have chosen will accept you as a new patient and what types of options they offer for patients who are self-pay. Some doctors offer discounts or will set up payment plans for their patients who do not have insurance, but you will need to ask so you aren't surprised when you get to your appointment. ° °2) Contact Your Local Health Department °Not all health departments have doctors that can see patients for sick visits, but many do, so it is worth a call to see if yours does. If you don't know where your local health department is, you can check in your phone book. The CDC also has a tool to help you locate your state's health department, and many state websites also have listings of all of their local health departments. ° °3) Find a Walk-in Clinic °If your illness is not likely to be very severe or complicated, you may want to try a walk in clinic. These are popping up all over the country in pharmacies, drugstores, and shopping centers. They're usually staffed by nurse practitioners or physician assistants that have been trained to treat common illnesses and complaints. They're usually fairly quick and inexpensive. However, if you have serious medical issues or chronic medical problems, these are probably not your best option. ° °No Primary Care Doctor: °- Call [facility] at  832-8000 - they can help you locate a primary care doctor that  accepts your insurance, provides certain services, etc. °- Physician Referral Service- 1-800-533-3463 ° °Chronic Pain Problems: °Organization         Address  Phone   Notes  °[facility]  (336) 297-2271 Patients need to be referred by their primary care doctor.  ° °Medication  Assistance: °Organization         Address  Phone   Notes  °[place] [facility] [address] °[place], [place] [address] (336) 641-8030 --Must be a resident of [place] °-- Must have NO insurance coverage whatsoever (no Medicaid/ Medicare, etc.) °-- The pt. MUST have a primary care doctor that directs their care regularly and follows them in the community °  °[facility]  (866) 331-1348   °[employer]  (888) 892-1162   ° °Agencies that provide inexpensive medical care: °Organization         Address  Phone   Notes  °[facility]  (336) 832-8035   °[facility]    (336) 832-7272   °[facility] [address] °[place], [place] [address] (336) 832-4777   °[facility] [address], °[place] (336) 271-4999   °[facility]    (336) 373-0678   °[facility]    (336) 272-1050   °[facility] ° [address], [place] Phone:  (336) 832-4444, Fax:  (336) 832-4440 Hours of Operation:  9 am - 6 pm, M-F.  Also accepts Medicaid/Medicare and self-pay.  °[facility] ° [address], [place] Phone: (336) 832-3150, Fax: (336) 832-3151. Hours of Operation:  8:30 am - 5:30 pm, M-F.  Also accepts Medicaid and self-pay.  °[facility] [place] [address], [place] Phone: (336) 878-6027   °[facility] [address], [place], [place] (336)723-1848, Ext. 123 Mondays & Thursdays: 7-9 AM.  First 15 patients are seen on a first come, first serve basis. °  ° °Medicaid-accepting [place] Providers: ° °Organization         Address  Phone   Notes  °[facility] [address], [place] (336) 641-2100 Also accepts self-pay patients.  °[facility] [address], [place] ° (336) 856-9996   °[facility] [address], [place]  (336) 288-8857   °[facility] [address] [place] [address], [place] (336) 299-7000   °Veita Bland [address], [place]  ° (336) 373-1557 Only accepts [place] Access Medicaid patients after they have their name applied to their card.  ° °Self-Pay (no insurance) in [place]: ° °Organization         Address  Phone   Notes  °Sickle Cell Patients, [facility] [address], [place] (336) 832-1970   °[facility] [address], [place] (336) 832-4400   °[facility] ° [address], [place] (336) 992-4800   °[facility]/Dr. Osei-Bonsu ° [address] [place] [address], [place] or [address], [place] (336) 841-8500 Phone number for both [place] and [place] locations is the same.  °[facility] [address], [place] (336) 299-0000   °[facility] [address] [place] [address], [place] or [address] (336) 852-7530 °(336) 878-2260   °[facility] [address], [place] (336) 350-1642, phone; (336) 294-5005, fax Sees patients 1st and 3rd Saturday of every month.  Must not qualify for public or private insurance (i.e. Medicaid, Medicare, [place] Health Choice, Veterans' Benefits) • Household income should be no more than 200% of the poverty level •The clinic cannot treat you if you are pregnant or think you are pregnant • Sexually transmitted diseases are not treated at the clinic.  ° ° °Dental Care: °Organization         Address  Phone  Notes  °[place] [facility] [address], [place] (336) 641-6152 Accepts children up to age 21 who are enrolled in Medicaid or [place] Health Choice; pregnant women with a Medicaid card; and children who have applied for Medicaid or [place] Health Choice, but were declined, whose parents can pay a reduced fee at time of service.  °[place]  [facility] [place]  [address], [place] (336) 641-7733 Accepts children up to age 21 who are enrolled in Medicaid or [place] Health Choice; pregnant women with a Medicaid card; and children who have applied for Medicaid or [place] Health Choice, but were declined, whose parents can pay a reduced fee at time of service.  °[facility] ° [address], [place] (336) 641-4533 Patients are seen by appointment only. Walk-ins are not accepted. [facility] will see patients 18 years of age and older. °Monday - Tuesday (8am-5pm) °Most Wednesdays (8:30-5pm) °$30 per visit, cash only  °[facility] ° [address], [place] (336) 641-4533 Patients are seen by appointment only. Walk-ins are not accepted. [facility] will see patients 18 years of age and older. °One   Wednesday Evening (Monthly: Volunteer Based).  $30 per visit, cash only  °[facility]  (919) 537-3737 for adults; Children under age 4, call Graduate Pediatric Dentistry at (919) [phone]. Children aged 4-14, please call (919) [phone] to request a pediatric application. ° Dental services are provided in all areas of dental care including fillings, crowns and bridges, complete and partial dentures, implants, gum treatment, root canals, and extractions. Preventive care is also provided. Treatment is provided to both adults and children. °Patients are selected via a lottery and there is often a waiting list. °  °[facility] [address], °[place] ° (336) 763-8833 [email or web address].[email or web address] °  °[facility] [address], [place], [place] (336)723-1848, Ext. 123 Second and Fourth Thursday of each month, opens at 6:30 AM; Clinic ends at 9 AM.  Patients are seen on a first-come first-served basis, and a limited number are seen during each clinic.  ° °[facility] ° [address], [place], [place] (336) 723-7904    Eligibility Requirements °You must have lived in [place], or [place] counties for at least the last three months. °  You cannot be eligible for state or federal sponsored healthcare insurance, including Veterans Administration, Medicaid, or Medicare. °  You generally cannot be eligible for healthcare insurance through your employer.  °  How to apply: °Eligibility screenings are held every Tuesday and Wednesday afternoon from 1:00 pm until 4:00 pm. You do not need an appointment for the interview!  °[facility] [address], [place], [place] 336-631-2330   °[facility]  336-342-8273   °[facility]  336-703-3100   °[facility]  336-570-6415   ° °Behavioral Health Resources in the Community: °Intensive Outpatient Programs °Organization         Address  Phone  Notes  °[facility] [address], [place], [place] 336-878-6098   °[facility] [address], [place], [place] 336-832-9800   °[facility] [address], [place], [place] ° 336-882-2125   °[facility] [address],  °[place], [place] 1-800-853-5163 or 336-641-4981   °Substance Abuse Resources °Organization         Address  Phone  Notes  °[facility]  336-882-2125   °[facility]  336-784-9470   °[facility]  336-285-9073   °[facility]  336-845-3988   °Residential & Outpatient Substance Abuse Program  1-800-659-3381   °Psychological Services °Organization         Address  Phone  Notes  °[facility]  336- 832-9600   °[facility]  336- 378-7881   °[facility] [address], [place] 1-800-853-5163 or 336-641-4981   ° °Mobile Crisis Teams °Organization         Address  Phone  Notes  °[facility], Mobile Crisis Care Unit  1-877-626-1772   °Assertive °[facility] ° [address]  [place], [place] 336-834-9664   °Sharon DeEsch [address] °[place] 336-554-5454   ° °Self-Help/Support Groups °Organization         Address  Phone             Notes  °[facility]  - variety of support groups  336- 373-1402 Call for more information  °Narcotics Anonymous (NA), [facility] [address], °[place]  2 meetings at this location  ° °  Residential Treatment Programs Organization         Address  Phone  Notes  [facility] [address],    [place]  3-094-076-8088   [facility]  [address] [place] [address], [place], [place] 945-859-2924   [facility] [address] [place], [place] 462-863-8177 Admissions: 8am-3pm M-F  [facility] [address]    [place], [place] 116-579-0383   [facility] [address] [place] [address] [place], [place], [place] 338-329-1916   [facility] [address]  [place], [place] 606-004-5997   [facility] - Intensive Outpatient [address], [place], [place] 741-423-9532   [facility] ([facility] [address] [address]  [place], [place] 0-233-435-6861 or 312-765-6726   [facility] [address] [place], [place] 155-208-0223 Accepts Medicaid  [facility] [address]  [place] 3-612-244-9753 Substance Abuse/Addiction Treatment   [facility]         Address  Phone  Notes  [facility]  (469)848-1719   Angie Fava, PhD [address] Ervin Knack [place], [place]   231 596 9333 or (936)125-1571   [facility]   [address] [place], [place] 438-860-0578   [facility] [address] [place], [place] [phone] Insurance/Medicaid/sponsorship through [facility] and Families [address]                                    [place], [place] [phone] Therapy/tele-psych/case    [facility] [address], [place] 772-303-9251    Dr. Lolly Mustache  418-100-8069   [facility]  [employer] [facility] Dept. 1) [address] [place] 2) [address] [place] 3)  [address], [place] ([phone]  (269)[phone]   [facility] Child Abuse Hotline 779-476-3744 or 925-552-0199 (After Hours)      Take the prescription as directed.  Call your regular medical doctor and Neurologist today to schedule a follow up appointment within the next week.  Return to the Emergency Department immediately sooner if worsening.

## 2015-04-26 NOTE — ED Notes (Signed)
PT states she needs her Phenytoin EX 100mg  po (3 capsules at bedtime) refilled. PT states she hasn't missed a dosage of her medication and will run out of med after tonight.

## 2015-04-26 NOTE — ED Notes (Signed)
Pt reports has one dose of her dilantin left and doesn't want to miss any doses.  Pt says can't get in to the Free clinic for another year to get her medication refilled.

## 2015-04-26 NOTE — Progress Notes (Signed)
CM called to ED for medication assistance for dilantin. Pt stated that she has been trying to help assist with meds from [facility] and they stated that she is not eligible until 2017. Encouraged pt to try the [facility] Dept for medication assistance. MATCH voucher given and stipulations explained. Pt verbalized understanding.

## 2015-05-26 ENCOUNTER — Encounter (HOSPITAL_COMMUNITY): Payer: Self-pay | Admitting: *Deleted

## 2015-05-26 ENCOUNTER — Emergency Department (HOSPITAL_COMMUNITY)
Admission: EM | Admit: 2015-05-26 | Discharge: 2015-05-26 | Disposition: A | Discharge status: Home / Self Care | Payer: Self-pay | Attending: Emergency Medicine | Admitting: Emergency Medicine

## 2015-05-26 DIAGNOSIS — Z760 Encounter for issue of repeat prescription: Secondary | ICD-10-CM | POA: Insufficient documentation

## 2015-05-26 DIAGNOSIS — G40909 Epilepsy, unspecified, not intractable, without status epilepticus: Secondary | ICD-10-CM | POA: Insufficient documentation

## 2015-05-26 DIAGNOSIS — Z720 Tobacco use: Secondary | ICD-10-CM | POA: Insufficient documentation

## 2015-05-26 MED ORDER — PHENYTOIN SODIUM EXTENDED 100 MG PO CAPS: ORAL_CAPSULE | ORAL | Status: DC

## 2015-05-26 NOTE — ED Provider Notes (Signed)
CSN: [id number]     Arrival date & time 05/26/15  1008 History   None    Chief Complaint  Patient presents with  . Medication Refill     (Consider location/radiation/quality/duration/timing/severity/associated sxs/prior Treatment) HPI Comments: Patient presents to the emergency department with request for her Dilantin medication to be refilled.  Patient is a 49 year old female with a history of seizures, and carpal tunnel syndrome who presents to the emergency department requesting refill on her Dilantin. The patient states that when she misses doses she starts to have more seizures, and she is concerned about that. She has 1 more dose of her current medication. It is of note that she was seen in the emergency department on June 15 at which time she was again requesting refill on her Dilantin medication. She states that she is trying to be seen by the free clinic, but is having complications. She also states she has some complications with insurance. The patient has not had any recent seizure activity. His been no temperature changes, no diet changes, and no activity changes.    The history is provided by the patient.    Past Medical History  Diagnosis Date  . Carpal tunnel syndrome   . Seizures    Past Surgical History  Procedure Laterality Date  . Tubal ligation     Family History  Problem Relation Age of Onset  . Hypertension Mother   . Hypertension Sister    History  Substance Use Topics  . Smoking status: Current Some Day Smoker -- 0.50 packs/day for 35 years    Types: Cigarettes  . Smokeless tobacco: Former Neurosurgeon  . Alcohol Use: No   OB History    Gravida Para Term Preterm AB TAB SAB Ectopic Multiple Living   Review of Systems  Neurological: Positive for seizures.  All other systems reviewed and are negative.     Allergies  Review of patient's allergies indicates no known allergies.  Home Medications   Prior to Admission medications    Medication Sig Start Date End Date Taking? Authorizing Provider  HYDROcodone-acetaminophen (NORCO) 5-325 MG per tablet Take 1-2 tablets by mouth every 6 (six) hours as needed (for pain). Patient not taking: Reported on 02/18/2015 08/23/14   Paula Libra, MD  naproxen sodium (ANAPROX) 275 MG tablet Take one tablet twice daily while wrist is painful. Patient not taking: Reported on 02/18/2015 08/23/14   Paula Libra, MD  nitrofurantoin (MACRODANTIN) 100 MG capsule Take one pill twice a day for 5 days Patient not taking: Reported on 02/18/2015 05/28/12   Annamarie Dawley, MD  phenytoin (DILANTIN) 100 MG ER capsule Take 3 capsules (300 mg total) by mouth at bedtime. 04/26/15   Samuel Jester, DO   BP 120/79 mmHg  Pulse 77  Temp(Src) 98 F (36.7 C) (Oral)  Resp 16  Ht  (1.626 m)  Wt 163 lb (73.936 kg)  BMI 27.97 kg/m2  SpO2 100%  LMP 05/29/2011 Physical Exam  Constitutional: She is oriented to person, place, and time. She appears well-developed and well-nourished.  Non-toxic appearance.  HENT:  Head: Normocephalic.  Right Ear: Tympanic membrane and external ear normal.  Left Ear: Tympanic membrane and external ear normal.  Eyes: EOM and lids are normal. Pupils are equal, round, and reactive to light.  Neck: Normal range of motion. Neck supple. Carotid bruit is not present.  Cardiovascular: Normal rate, regular rhythm, normal heart sounds,  intact distal pulses and normal pulses.   Pulmonary/Chest: Breath sounds normal. No respiratory distress.  Abdominal: Soft. Bowel sounds are normal. There is no tenderness. There is no guarding.  Musculoskeletal: Normal range of motion.  Lymphadenopathy:       Head (right side): No submandibular adenopathy present.       Head (left side): No submandibular adenopathy present.    She has no cervical adenopathy.  Neurological: She is alert and oriented to person, place, and time. She has normal strength. No cranial nerve deficit or sensory deficit. She  exhibits normal muscle tone. Coordination normal.  Skin: Skin is warm and dry.  Psychiatric: She has a normal mood and affect. Her speech is normal.  Nursing note and vitals reviewed.   ED Course  Procedures (including critical care time) Labs Review Labs Reviewed - No data to display  Imaging Review No results found.   EKG Interpretation None      MDM  Vital signs are within normal limits. No seizure activity noted in the emergency department. No gross neurologic deficits appreciated at this time. Patient is nontoxic-appearing.  Prescription for the Dilantin 100 mg extended release given to the patient. I discussed with the patient the need to follow-up with one of the local clinics as she finds a primary care physician. The patient acknowledges my instructions and this discharge plan.    Final diagnoses:  None    **I have reviewed nursing notes, vital signs, and all appropriate lab and imaging results for this patient.Ivery Quale*    Fredrick Geoghegan, PA-C 05/26/15 1152  Zadie Rhineonald Wickline, MD 05/26/15 1210

## 2015-05-26 NOTE — Discharge Instructions (Signed)
Medication Refill, Emergency Department °We have refilled your medication today as a courtesy to you. It is best for your medical care, however, to take care of getting refills done through your primary caregiver's office. They have your records and can do a better job of follow-up than we can in the emergency department. °On maintenance medications, we often only prescribe enough medications to get you by until you are able to see your regular caregiver. This is a more expensive way to refill medications. °In the future, please plan for refills so that you will not have to use the emergency department for this. °Thank you for your help. Your help allows us to better take care of the daily emergencies that enter our department. °Document Released: 02/14/2004 Document Revised: 01/20/2012 Document Reviewed: 02/04/2014 °[employer]® Patient Information ©2015 [employer]. This information is not intended to replace advice given to you by your health care provider. Make sure you discuss any questions you have with your health care provider. ° °

## 2015-05-26 NOTE — ED Notes (Signed)
Pt states she need a refill of her phenytoin. Pt has bottle with her and has not missed any doses.

## 2015-08-16 ENCOUNTER — Encounter (HOSPITAL_COMMUNITY): Payer: Self-pay | Admitting: *Deleted

## 2015-08-16 ENCOUNTER — Emergency Department (HOSPITAL_COMMUNITY)
Admission: EM | Admit: 2015-08-16 | Discharge: 2015-08-16 | Disposition: A | Discharge status: Home / Self Care | Payer: Self-pay | Attending: Emergency Medicine | Admitting: Emergency Medicine

## 2015-08-16 DIAGNOSIS — M79641 Pain in right hand: Secondary | ICD-10-CM

## 2015-08-16 DIAGNOSIS — Z720 Tobacco use: Secondary | ICD-10-CM | POA: Insufficient documentation

## 2015-08-16 DIAGNOSIS — G5601 Carpal tunnel syndrome, right upper limb: Secondary | ICD-10-CM | POA: Insufficient documentation

## 2015-08-16 DIAGNOSIS — G40909 Epilepsy, unspecified, not intractable, without status epilepticus: Secondary | ICD-10-CM | POA: Insufficient documentation

## 2015-08-16 DIAGNOSIS — R000 Tachycardia, unspecified: Secondary | ICD-10-CM | POA: Insufficient documentation

## 2015-08-16 MED ORDER — IBUPROFEN 400 MG PO TABS
600.0000 mg | ORAL_TABLET | Freq: Once | ORAL | Status: AC
  Administered 2015-08-16: 600 mg via ORAL
  Filled 2015-08-16: qty 2

## 2015-08-16 NOTE — ED Notes (Signed)
Pt alert & oriented x4, stable gait. Patient given discharge instructions, paperwork & prescription(s). Patient  instructed to stop at the registration desk to finish any additional paperwork. Patient verbalized understanding. Pt left department w/ no further questions. 

## 2015-08-16 NOTE — Discharge Instructions (Signed)
Wear the right wrist splint. Use ice and heat on your wrist. Take either ibuprofen 600 mg 4 times a day OR aleve 2 tabs OTC twice a day for pain.  You can be rechecked by Dr Romeo Apple, the orthopedist on call, if you continue to have pain. You need to get a primary care doctor.   Carpal Tunnel Release, Care After Refer to this sheet in the next few weeks. These instructions provide you with information about caring for yourself after your procedure. Your health care provider may also give you more specific instructions. Your treatment has been planned according to current medical practices, but problems sometimes occur. Call your health care provider if you have any problems or questions after your procedure. WHAT TO EXPECT AFTER THE PROCEDURE After your procedure, it is typical to have the following:  Pain.  Numbness.  Tingling.  Swelling.  Stiffness.  Bruising. HOME CARE INSTRUCTIONS  Take medicines only as directed by your health care provider.  There are many different ways to close and cover an incision, including stitches (sutures), skin glue, and adhesive strips. Follow your health care provider's instructions about:  Incision care.  Bandage (dressing) changes and removal.  Incision closure removal.  Wear a splint or a brace as directed by your surgeon. You may need to do this for 2-3 weeks.  Keep your hand raised (elevated) above the level of your heart while you are resting. Move your fingers often.  Avoid activities that cause hand pain.  Ask your surgeon when you can start to do all of your usual activities again, such as:  Driving.  Returning to work.  Bathing and swimming.  Keep all follow-up visits as directed by your health care provider. This is important. You may need physical therapy for several months to speed healing and regain movement. SEEK MEDICAL CARE IF:  You have drainage, redness, swelling, or pain at your incision site.  You have a  fever.  You have chills.  Your pain medicine is not working.  Your symptoms do not go away after 2 months.  Your symptoms go away and then return. SEEK IMMEDIATE MEDICAL CARE IF:  You have pain or numbness that is getting worse.  Your fingers change color.  You are not able to move your fingers.   This information is not intended to replace advice given to you by your health care provider. Make sure you discuss any questions you have with your health care provider.   Document Released: 05/17/2005 Document Revised: 11/18/2014 Document Reviewed: 06/15/2014 Elsevier Interactive Patient Education 2016 Elsevier Inc.  Cryotherapy Cryotherapy is when you put ice on your injury. Ice helps lessen pain and puffiness (swelling) after an injury. Ice works the best when you start using it in the first 24 to 48 hours after an injury. HOME CARE  Put a dry or damp towel between the ice pack and your skin.  You may press gently on the ice pack.  Leave the ice on for no more than 10 to 20 minutes at a time.  Check your skin after 5 minutes to make sure your skin is okay.  Rest at least 20 minutes between ice pack uses.  Stop using ice when your skin loses feeling (numbness).  Do not use ice on someone who cannot tell you when it hurts. This includes small children and people with memory problems (dementia). GET HELP RIGHT AWAY IF:  You have white spots on your skin.  Your skin turns blue or  pale.  Your skin feels waxy or hard.  Your puffiness gets worse. MAKE SURE YOU:   Understand these instructions.  Will watch your condition.  Will get help right away if you are not doing well or get worse.   This information is not intended to replace advice given to you by your health care provider. Make sure you discuss any questions you have with your health care provider.   Document Released: 04/15/2008 Document Revised: 01/20/2012 Document Reviewed: 06/20/2011 Elsevier Interactive  Patient Education 2016 Elsevier Inc.  [employer] Therapy Heat therapy can help ease sore, stiff, injured, and tight muscles and joints. Heat relaxes your muscles, which may help ease your pain. Heat therapy should only be used on old, pre-existing, or long-lasting (chronic) injuries. Do not use heat therapy unless told by your doctor. HOW TO USE HEAT THERAPY There are several different kinds of heat therapy, including:  Moist heat pack.  Warm water bath.  Hot water bottle.  Electric heating pad.  Heated gel pack.  Heated wrap.  Electric heating pad. GENERAL HEAT THERAPY RECOMMENDATIONS   Do not sleep while using heat therapy. Only use heat therapy while you are awake.  Your skin may turn pink while using heat therapy. Do not use heat therapy if your skin turns red.  Do not use heat therapy if you have new pain.  High heat or long exposure to heat can cause burns. Be careful when using heat therapy to avoid burning your skin.  Do not use heat therapy on areas of your skin that are already irritated, such as with a rash or sunburn. GET HELP IF:   You have blisters, redness, swelling (puffiness), or numbness.  You have new pain.  Your pain is worse. MAKE SURE YOU:  Understand these instructions.  Will watch your condition.  Will get help right away if you are not doing well or get worse.   This information is not intended to replace advice given to you by your health care provider. Make sure you discuss any questions you have with your health care provider.   Document Released: 01/20/2012 Document Revised: 11/18/2014 Document Reviewed: 12/21/2013 Elsevier Interactive Patient Education [employer]

## 2015-08-16 NOTE — ED Notes (Signed)
Pt complaining of right hand and wrist pain. Pt denies any accident or injury.

## 2015-08-16 NOTE — ED Provider Notes (Signed)
CSN: [id number]     Arrival date & time 08/16/15  0049 History  By signing my name below, I, Cheryl Peters, attest that this documentation has been prepared under the direction and in the presence of Devoria Albe, MD started at 00:53 AM. Electronically Signed: Lyndel Peters, ED Scribe. 08/16/2015. 1:06 AM.   Chief Complaint  Patient presents with  . Hand Pain   The history is provided by the patient. No language interpreter was used.   HPI Comments: Cheryl Peters is a 49 y.o. female, with a PMhx of seizures, who presents to the Emergency Department complaining of constant, sore right wrist and right hand pain that has been present for 2 days but progressively worsened this evening while trying to sleep. She has a wrist immobilizer placed from last year when she was seen with left sided wrist pain. Pt is left-handed. She has not taken any alleviating medication. She notes a similar episode of pain in her left wrist and left hand last year that has resolved. Denies numbness or tingling in right hand. No overlying skin changes to right wrist or hand. Pt not followed by a PCP. Her dilantin prescription was last prescribed on an ED visit.   PCP none  Past Medical History  Diagnosis Date  . Carpal tunnel syndrome   . Seizures [facility])    Past Surgical History  Procedure Laterality Date  . Tubal ligation     Family History  Problem Relation Age of Onset  . Hypertension Mother   . Hypertension Sister    Social History  Substance Use Topics  . Smoking status: Current Some Day Smoker -- 0.50 packs/day for 35 years    Types: Cigarettes  . Smokeless tobacco: Former Neurosurgeon  . Alcohol Use: No  lives with husband  OB History    Gravida Para Term Preterm AB TAB SAB Ectopic Multiple Living   Review of Systems  Musculoskeletal: Positive for arthralgias ( right wrist, right hand).  Skin: Negative for color change and wound.  Neurological: Negative for numbness.  All other  systems reviewed and are negative.  Allergies  Review of patient's allergies indicates no known allergies.  Home Medications   Prior to Admission medications   Medication Sig Start Date End Date Taking? Authorizing Provider  acetaminophen (TYLENOL) 500 MG tablet Take 500-1,000 mg by mouth 2 (two) times daily as needed for headache (Hand pain associated with carpal tunnel).   Yes Historical Provider, MD  phenytoin (DILANTIN) 100 MG ER capsule 3 tablets at hs. 05/26/15  Yes Ivery Quale, PA-C   BP 142/91 mmHg  Pulse 113  Resp 18  Ht  (1.626 m)  Wt 165 lb (74.844 kg)  BMI 28.31 kg/m2  SpO2 98%  LMP 05/29/2011  Vital signs normal except tachycardia   Physical Exam  Constitutional: She is oriented to person, place, and time. She appears well-developed and well-nourished.  Non-toxic appearance. She does not appear ill. No distress.  HENT:  Head: Normocephalic and atraumatic.  Nose: No mucosal edema or rhinorrhea.  Mouth/Throat: Mucous membranes are normal. No dental abscesses or uvula swelling.  Eyes: Conjunctivae and EOM are normal.  Neck: Normal range of motion and full passive range of motion without pain.  Pulmonary/Chest: Effort normal. No respiratory distress. She has no rhonchi. She exhibits no crepitus.  Abdominal: Normal appearance.  Musculoskeletal: Normal range of motion. She exhibits tenderness. She exhibits no edema.  Diffuse pain in right wrist and right hand, she moves her right hand freely with dorsiflexion and extension in course of conversation but on exam she tries to restrict ROM exhibiting pain, mildly positive Phalen's sign, no Tinel's sign ,moves all extremities well.   Neurological: She is alert and oriented to person, place, and time. She has normal strength. No cranial nerve deficit.  Skin: Skin is warm, dry and intact. No rash noted. No erythema. No pallor.  Psychiatric: She has a normal mood and affect. Her speech is normal and behavior is normal. Her  mood appears not anxious.  Nursing note and vitals reviewed.   ED Course  Procedures   Medications  ibuprofen (ADVIL,MOTRIN) tablet 600 mg (600 mg Oral Given 08/16/15 0118)    DIAGNOSTIC STUDIES: Oxygen Saturation is 98% on RA, normal by my interpretation.    COORDINATION OF CARE: 1:06 AM Discussed treatment plan with pt at bedside and pt agreed to plan. Pt was placed in a velcro wrist splint.    MDM patient is left-handed and had left sided carpal tunnel syndrome last year. She does not work or do any repetitive motion. She presents now with 2 days of similar pain in her right wrist. Patient has stocking glove pain of her wrist and hand without localization. She uses her hand normally during the course of conversation. She does however states it hurts when she tries to grip her hand. She was placed in the right wrist splint and told to take non-steroidal anti-inflammatory drugs over-the-counter for her pain. Hopefully the immobilization will improve her pain.    Final diagnoses:  Right hand pain  Carpal tunnel syndrome, right   Plan OTC motrin or aleve  Plan discharge  Devoria Albe, MD, FACEP   I personally performed the services described in this documentation, which was scribed in my presence. The recorded information has been reviewed and considered.  Devoria Albe, MD, Concha Pyo, MD 08/16/15 [phone]

## 2015-10-05 ENCOUNTER — Encounter: Payer: Self-pay | Admitting: Emergency Medicine

## 2015-10-05 ENCOUNTER — Emergency Department
Admission: EM | Admit: 2015-10-05 | Discharge: 2015-10-05 | Disposition: A | Discharge status: Home / Self Care | Payer: Self-pay | Attending: Emergency Medicine | Admitting: Emergency Medicine

## 2015-10-05 DIAGNOSIS — F17210 Nicotine dependence, cigarettes, uncomplicated: Secondary | ICD-10-CM | POA: Insufficient documentation

## 2015-10-05 DIAGNOSIS — R569 Unspecified convulsions: Secondary | ICD-10-CM | POA: Insufficient documentation

## 2015-10-05 LAB — URINALYSIS COMPLETE WITH MICROSCOPIC (ARMC ONLY)
Bilirubin Urine: NEGATIVE
Glucose, UA: NEGATIVE mg/dL
Ketones, ur: NEGATIVE mg/dL
Leukocytes, UA: NEGATIVE
Nitrite: NEGATIVE
Protein, ur: 30 mg/dL — AB
Specific Gravity, Urine: 1.014 (ref 1.005–1.030)
pH: 6 (ref 5.0–8.0)

## 2015-10-05 LAB — CBC
HCT: 42.8 % (ref 35.0–47.0)
Hemoglobin: 13.9 g/dL (ref 12.0–16.0)
MCH: 27.8 pg (ref 26.0–34.0)
MCHC: 32.4 g/dL (ref 32.0–36.0)
MCV: 85.9 fL (ref 80.0–100.0)
Platelets: 267 10*3/uL (ref 150–440)
RBC: 4.98 MIL/uL (ref 3.80–5.20)
RDW: 14.7 % — ABNORMAL HIGH (ref 11.5–14.5)
WBC: 10 10*3/uL (ref 3.6–11.0)

## 2015-10-05 LAB — COMPREHENSIVE METABOLIC PANEL
ALT: 16 U/L (ref 14–54)
AST: 18 U/L (ref 15–41)
Albumin: 4 g/dL (ref 3.5–5.0)
Alkaline Phosphatase: 67 U/L (ref 38–126)
Anion gap: 6 (ref 5–15)
BUN: 18 mg/dL (ref 6–20)
CHLORIDE: 105 mmol/L (ref 101–111)
CO2: 30 mmol/L (ref 22–32)
Calcium: 9.4 mg/dL (ref 8.9–10.3)
Creatinine, Ser: 0.74 mg/dL (ref 0.44–1.00)
GFR calc non Af Amer: >60 mL/min (ref >60)
Glucose, Bld: 101 mg/dL — ABNORMAL HIGH (ref 65–99)
POTASSIUM: 4.2 mmol/L (ref 3.5–5.1)
Sodium: 141 mmol/L (ref 135–145)
Total Bilirubin: 0.3 mg/dL (ref 0.3–1.2)
Total Protein: 7.7 g/dL (ref 6.5–8.1)

## 2015-10-05 LAB — GLUCOSE, CAPILLARY: GLUCOSE-CAPILLARY: 104 mg/dL — AB (ref 65–99)

## 2015-10-05 MED ORDER — PHENYTOIN SODIUM EXTENDED 100 MG PO CAPS: ORAL_CAPSULE | ORAL | Status: AC

## 2015-10-05 MED ORDER — PHENYTOIN SODIUM EXTENDED 100 MG PO CAPS
400.0000 mg | ORAL_CAPSULE | ORAL | Status: AC
  Administered 2015-10-05: 400 mg via ORAL
  Filled 2015-10-05: qty 4

## 2015-10-05 MED ORDER — PHENYTOIN SODIUM EXTENDED 100 MG PO CAPS
ORAL_CAPSULE | ORAL | Status: AC
  Administered 2015-10-05: 400 mg via ORAL
  Filled 2015-10-05: qty 2

## 2015-10-05 MED ORDER — PHENYTOIN SODIUM EXTENDED 100 MG PO CAPS
200.0000 mg | ORAL_CAPSULE | Freq: Once | ORAL | Status: AC
  Administered 2015-10-05: 200 mg via ORAL
  Filled 2015-10-05: qty 2

## 2015-10-05 MED ORDER — PHENYTOIN SODIUM EXTENDED 100 MG PO CAPS
400.0000 mg | ORAL_CAPSULE | Freq: Once | ORAL | Status: AC
  Administered 2015-10-05: 400 mg via ORAL
  Filled 2015-10-05: qty 4

## 2015-10-05 NOTE — Discharge Instructions (Signed)
Epilepsy °Epilepsy is a disorder in which a person has repeated seizures over time. A seizure is a release of abnormal electrical activity in the brain. Seizures can cause a change in attention, behavior, or the ability to remain awake and alert (altered mental status). Seizures often involve uncontrollable shaking (convulsions).  °Most people with epilepsy lead normal lives. However, people with epilepsy are at an increased risk of falls, accidents, and injuries. Therefore, it is important to begin treatment right away. °CAUSES  °Epilepsy has many possible causes. Anything that disturbs the normal pattern of brain cell activity can lead to seizures. This may include:  °· Head injury. °· Birth trauma. °· High fever as a child. °· Stroke. °· Bleeding into or around the brain. °· Certain drugs. °· Prolonged low oxygen, such as what occurs after CPR efforts. °· Abnormal brain development. °· Certain illnesses, such as meningitis, encephalitis (brain infection), malaria, and other infections. °· An imbalance of nerve signaling chemicals (neurotransmitters).   °SIGNS AND SYMPTOMS  °The symptoms of a seizure can vary greatly from one person to another. Right before a seizure, you may have a warning (aura) that a seizure is about to occur. An aura may include the following symptoms: °· Fear or anxiety. °· Nausea. °· Feeling like the room is spinning (vertigo). °· Vision changes, such as seeing flashing lights or spots. °Common symptoms during a seizure include: °· Abnormal sensations, such as an abnormal smell or a bitter taste in the mouth.   °· Sudden, general body stiffness.   °· Convulsions that involve rhythmic jerking of the face, arm, or leg on one or both sides.   °· Sudden change in consciousness.   °¨ Appearing to be awake but not responding.   °¨ Appearing to be asleep but cannot be awakened.   °· Grimacing, chewing, lip smacking, drooling, tongue biting, or loss of bowel or bladder control. °After a seizure,  you may feel sleepy for a while.  °DIAGNOSIS  °Your health care provider will ask about your symptoms and take a medical history. Descriptions from any witnesses to your seizures will be very helpful in the diagnosis. A physical exam, including a detailed neurological exam, is necessary. Various tests may be done, such as:  °· An electroencephalogram (EEG). This is a painless test of your brain waves. In this test, a diagram is created of your brain waves. These diagrams can be interpreted by a specialist. °· An MRI of the brain.   °· A CT scan of the brain.   °· A spinal tap (lumbar puncture, LP). °· Blood tests to check for signs of infection or abnormal blood chemistry. °TREATMENT  °There is no cure for epilepsy, but it is generally treatable. Once epilepsy is diagnosed, it is important to begin treatment as soon as possible. For most people with epilepsy, seizures can be controlled with medicines. The following may also be used: °· A pacemaker for the brain (vagus nerve stimulator) can be used for people with seizures that are not well controlled by medicine. °· Surgery on the brain. °For some people, epilepsy eventually goes away. °HOME CARE INSTRUCTIONS  °· Follow your health care provider's recommendations on driving and safety in normal activities. °· Get enough rest. Lack of sleep can cause seizures. °· Only take over-the-counter or prescription medicines as directed by your health care provider. Take any prescribed medicine exactly as directed. °· Avoid any known triggers of your seizures. °· Keep a seizure diary. Record what you recall about any seizure, especially any possible trigger.   °· Make   sure the people you live and work with know that you are prone to seizures. They should receive instructions on how to help you. In general, a witness to a seizure should:   °¨ Cushion your head and body.   °¨ Turn you on your side.   °¨ Avoid unnecessarily restraining you.   °¨ Not place anything inside your  mouth.   °¨ Call for emergency medical help if there is any question about what has occurred.   °· Follow up with your health care provider as directed. You may need regular blood tests to monitor the levels of your medicine.   °SEEK MEDICAL CARE IF:  °· You develop signs of infection or other illness. This might increase the risk of a seizure.   °· You seem to be having more frequent seizures.   °· Your seizure pattern is changing.   °SEEK IMMEDIATE MEDICAL CARE IF:  °· You have a seizure that does not stop after a few moments.   °· You have a seizure that causes any difficulty in breathing.   °· You have a seizure that results in a very severe headache.   °· You have a seizure that leaves you with the inability to speak or use a part of your body.   °  °This information is not intended to replace advice given to you by your health care provider. Make sure you discuss any questions you have with your health care provider. °  °Document Released: 10/28/2005 Document Revised: 08/18/2013 Document Reviewed: 06/09/2013 °Elsevier Interactive Patient Education ©2016 Elsevier Inc. ° ° °Seizure, Adult °A seizure is abnormal electrical activity in the brain. Seizures usually last from 30 seconds to 2 minutes. There are various types of seizures. °Before a seizure, you may have a warning sensation (aura) that a seizure is about to occur. An aura may include the following symptoms:  °· Fear or anxiety. °· Nausea. °· Feeling like the room is spinning (vertigo). °· Vision changes, such as seeing flashing lights or spots. °Common symptoms during a seizure include: °· A change in attention or behavior (altered mental status). °· Convulsions with rhythmic jerking movements. °· Drooling. °· Rapid eye movements. °· Grunting. °· Loss of bladder and bowel control. °· Bitter taste in the mouth. °· Tongue biting. °After a seizure, you may feel confused and sleepy. You may also have an injury resulting from convulsions during the  seizure. °HOME CARE INSTRUCTIONS  °· If you are given medicines, take them exactly as prescribed by your health care provider. °· Keep all follow-up appointments as directed by your health care provider. °· Do not swim or drive or engage in risky activity during which a seizure could cause further injury to you or others until your health care provider says it is OK. °· Get adequate rest. °· Teach friends and family what to do if you have a seizure. They should: °¨ Lay you on the ground to prevent a fall. °¨ Put a cushion under your head. °¨ Loosen any tight clothing around your neck. °¨ Turn you on your side. If vomiting occurs, this helps keep your airway clear. °¨ Stay with you until you recover. °¨ Know whether or not you need emergency care. °SEEK IMMEDIATE MEDICAL CARE IF: °· The seizure lasts longer than 5 minutes. °· The seizure is severe or you do not wake up immediately after the seizure. °· You have an altered mental status after the seizure. °· You are having more frequent or worsening seizures. °Someone should drive you to the emergency department or call local emergency   services (911 in [place] °MAKE SURE YOU: °· Understand these instructions. °· Will watch your condition. °· Will get help right away if you are not doing well or get worse. °  °This information is not intended to replace advice given to you by your health care provider. Make sure you discuss any questions you have with your health care provider. °  °Document Released: 10/25/2000 Document Revised: 11/18/2014 Document Reviewed: 06/09/2013 °Elsevier Interactive Patient Education ©2016 [employer]. ° °

## 2015-10-05 NOTE — ED Provider Notes (Signed)
[facility] Emergency Department Provider Note  ____________________________________________  Time seen: 8:55 AM  I have reviewed the triage vital signs and the nursing notes.   HISTORY  Chief Complaint Seizures    HPI [name] is a 49 y.o. female is brought to the ED by EMS for a seizure. Her mother called EMS after noticing generalized convulsive activity. The patient has a history of epilepsy but has been off Dilantin for 2 months due to lack of insurance. She states that she used to be on it and used to just pay for it herself.She has not been able to see a doctor in follow-up.  Denies recent injuries or trauma, no recent illnesses, she is postmenopausal, no dysuria frequency urgency, chest pain shortness of breath headache numbness tingling weakness or vision changes.   Past Medical History  Diagnosis Date  . Carpal tunnel syndrome   . Seizures (HCC)      There are no active problems to display for this patient.    Past Surgical History  Procedure Laterality Date  . Tubal ligation       Current Outpatient Rx  Name  Route  Sig  Dispense  Refill  . acetaminophen (TYLENOL) 500 MG tablet   Oral   Take 500-1,000 mg by mouth 2 (two) times daily as needed for headache (Hand pain associated with carpal tunnel).         . phenytoin (DILANTIN) 100 MG ER capsule      3 tablets at hs.   100 capsule   0      Allergies Review of patient's allergies indicates no known allergies.   Family History  Problem Relation Age of Onset  . Hypertension Mother   . Hypertension Sister     Social History Social History  Substance Use Topics  . Smoking status: Current Some Day Smoker -- 0.50 packs/day for 35 years    Types: Cigarettes  . Smokeless tobacco: Former NeurosurgeonUser  . Alcohol Use: No    Review of Systems  Constitutional:   No fever or chills. No weight changes Eyes:   No blurry vision or double vision.  ENT:   No sore  throat. Cardiovascular:   No chest pain. Respiratory:   No dyspnea or cough. Gastrointestinal:   Negative for abdominal pain, vomiting and diarrhea.  No BRBPR or melena. Genitourinary:   Negative for dysuria, urinary retention, bloody urine, or difficulty urinating. Musculoskeletal:   Negative for back pain. No joint swelling or pain. Skin:   Negative for rash. Neurological:   Negative for headaches, focal weakness or numbness. Psychiatric:  No anxiety or depression.   Endocrine:  No hot/cold intolerance, changes in energy, or sleep difficulty.  10-point ROS otherwise negative.  ____________________________________________   PHYSICAL EXAM:  VITAL SIGNS: ED Triage Vitals  Enc Vitals Group     BP 10/05/15 0901 146/87 mmHg     Pulse Rate 10/05/15 0901 107     Resp 10/05/15 0901 15     Temp 10/05/15 0901 97.8 F (36.6 C)     Temp Source 10/05/15 0901 Oral     SpO2 10/05/15 0901 99 %     Weight 10/05/15 0901 177 lb (80.287 kg)     Height 10/05/15 0901 5\' 6"  (1.676 m)     Head Cir --      Peak Flow --      Pain Score --      Pain Loc --      Pain  Edu? --      Excl. in GC? --      Constitutional:   Alert and oriented. Well appearing and in no distress. Eyes:   No scleral icterus. No conjunctival pallor. PERRL. EOMI ENT   Head:   Normocephalic and atraumatic.   Nose:   No congestion/rhinnorhea. No septal hematoma   Mouth/Throat:   MMM, no pharyngeal erythema. No peritonsillar mass. No uvula shift. No tongue abrasions   Neck:   No stridor. No SubQ emphysema. No meningismus. Hematological/Lymphatic/Immunilogical:   No cervical lymphadenopathy. Cardiovascular:   RRR. Normal and symmetric distal pulses are present in all extremities. No murmurs, rubs, or gallops. Respiratory:   Normal respiratory effort without tachypnea nor retractions. Breath sounds are clear and equal bilaterally. No wheezes/rales/rhonchi. Gastrointestinal:   Soft and nontender. No distention.  There is no CVA tenderness.  No rebound, rigidity, or guarding. Genitourinary:   deferred Musculoskeletal:   Nontender with normal range of motion in all extremities. No joint effusions.  No lower extremity tenderness.  No edema. Neurologic:   Normal speech and language.  CN 2-10 normal. Motor grossly intact. No pronator drift.  Normal gait. No gross focal neurologic deficits are appreciated.  Skin:    Skin is warm, dry and intact. No rash noted.  No petechiae, purpura, or bullae. Psychiatric:   Mood and affect are normal. Speech and behavior are normal. Patient exhibits appropriate insight and judgment.  ____________________________________________    LABS (pertinent positives/negatives) (all labs ordered are listed, but only abnormal results are displayed) Labs Reviewed  CBC - Abnormal; Notable for the following:    RDW 14.7 (*)    All other components within normal limits  URINALYSIS COMPLETEWITH MICROSCOPIC ([facility] ONLY) - Abnormal; Notable for the following:    Color, Urine YELLOW (*)    APPearance CLOUDY (*)    Hgb urine dipstick 1+ (*)    Protein, ur 30 (*)    Bacteria, UA MANY (*)    Squamous Epithelial / LPF 0-5 (*)    All other components within normal limits  GLUCOSE, CAPILLARY - Abnormal; Notable for the following:    Glucose-Capillary 104 (*)    All other components within normal limits  COMPREHENSIVE METABOLIC PANEL  CBG MONITORING, ED   ____________________________________________   EKG  Interpreted by me Tachycardia rate 106, normal axis intervals QRS and ST segments and T waves  ____________________________________________    RADIOLOGY    ____________________________________________   PROCEDURES   ____________________________________________   INITIAL IMPRESSION / ASSESSMENT AND PLAN / ED COURSE  Pertinent labs & imaging results that were available during my care of the patient were reviewed by me and considered in my medical decision  making (see chart for details).  Patient resents with seizure likely due to Dilantin noncompliance. We will check labs to rule out electrolyte abnormality or urinary tract infection. We will start reloading the patient with Dilantin here in the emergency department and I'll provide her with a prescription to refill her medications. No evidence of significant trauma, low suspicion for meningitis encephalitis or intracranial hemorrhage.  ----------------------------------------- 12:25 PM on 10/05/2015 -----------------------------------------  Workup unremarkable. Patient has asymptomatic bacteriuria without signs of infection. Patient was given a Dilantin reload in the emergency department. I will continue prescription so that she has axis of the phenytoin and we'll have [facility] follow[facility].   ____________________________________________   FINAL CLINICAL IMPRESSION(S) / ED DIAGNOSES  Final diagnoses:  Seizure (HCC)  Sharman Cheek, MD 10/05/15 1226

## 2015-10-05 NOTE — ED Notes (Signed)
Pt arrived from family home by EMS, post seizure. EMS reports pt has a hx of epilepsy, takes dilanton and has been non-compliant with meds for over 2 months. P

## 2015-12-23 ENCOUNTER — Emergency Department (HOSPITAL_COMMUNITY)
Admission: EM | Admit: 2015-12-23 | Discharge: 2015-12-23 | Disposition: A | Discharge status: Home / Self Care | Payer: Self-pay | Attending: Emergency Medicine | Admitting: Emergency Medicine

## 2015-12-23 ENCOUNTER — Encounter (HOSPITAL_COMMUNITY): Payer: Self-pay | Admitting: Emergency Medicine

## 2015-12-23 DIAGNOSIS — Z8669 Personal history of other diseases of the nervous system and sense organs: Secondary | ICD-10-CM | POA: Insufficient documentation

## 2015-12-23 DIAGNOSIS — Z5181 Encounter for therapeutic drug level monitoring: Secondary | ICD-10-CM

## 2015-12-23 DIAGNOSIS — R791 Abnormal coagulation profile: Secondary | ICD-10-CM | POA: Insufficient documentation

## 2015-12-23 DIAGNOSIS — R569 Unspecified convulsions: Secondary | ICD-10-CM | POA: Insufficient documentation

## 2015-12-23 DIAGNOSIS — F17210 Nicotine dependence, cigarettes, uncomplicated: Secondary | ICD-10-CM | POA: Insufficient documentation

## 2015-12-23 LAB — COMPREHENSIVE METABOLIC PANEL
ALT: 17 U/L (ref 14–54)
ANION GAP: 8 (ref 5–15)
AST: 19 U/L (ref 15–41)
Albumin: 3.9 g/dL (ref 3.5–5.0)
Alkaline Phosphatase: 105 U/L (ref 38–126)
BUN: 22 mg/dL — ABNORMAL HIGH (ref 6–20)
CHLORIDE: 104 mmol/L (ref 101–111)
CO2: 25 mmol/L (ref 22–32)
CREATININE: 0.66 mg/dL (ref 0.44–1.00)
Calcium: 9 mg/dL (ref 8.9–10.3)
Glucose, Bld: 84 mg/dL (ref 65–99)
POTASSIUM: 4.2 mmol/L (ref 3.5–5.1)
Sodium: 137 mmol/L (ref 135–145)
Total Bilirubin: 0.4 mg/dL (ref 0.3–1.2)
Total Protein: 7.5 g/dL (ref 6.5–8.1)

## 2015-12-23 LAB — ETHANOL: ALCOHOL ETHYL (B): 8 mg/dL — AB (ref <5)

## 2015-12-23 LAB — CBC WITH DIFFERENTIAL/PLATELET
BASOS ABS: 0 10*3/uL (ref 0.0–0.1)
Basophils Relative: 0 %
EOS PCT: 0 %
Eosinophils Absolute: 0 10*3/uL (ref 0.0–0.7)
HCT: 39.8 % (ref 36.0–46.0)
Hemoglobin: 13.2 g/dL (ref 12.0–15.0)
LYMPHS PCT: 5 %
Lymphs Abs: 0.8 10*3/uL (ref 0.7–4.0)
MCH: 28.1 pg (ref 26.0–34.0)
MCHC: 33.2 g/dL (ref 30.0–36.0)
MCV: 84.9 fL (ref 78.0–100.0)
Monocytes Absolute: 0.7 10*3/uL (ref 0.1–1.0)
Monocytes Relative: 5 %
NEUTROS ABS: 14 10*3/uL — AB (ref 1.7–7.7)
Neutrophils Relative %: 90 %
PLATELETS: 299 10*3/uL (ref 150–400)
RBC: 4.69 MIL/uL (ref 3.87–5.11)
RDW: 15.1 % (ref 11.5–15.5)
WBC: 15.6 10*3/uL — AB (ref 4.0–10.5)

## 2015-12-23 LAB — PHENYTOIN LEVEL, TOTAL: PHENYTOIN LVL: 8.6 ug/mL — AB (ref 10.0–20.0)

## 2015-12-23 MED ORDER — LORAZEPAM 2 MG/ML IJ SOLN
1.0000 mg | Freq: Once | INTRAMUSCULAR | Status: AC
  Administered 2015-12-23: 1 mg via INTRAVENOUS
  Filled 2015-12-23: qty 1

## 2015-12-23 MED ORDER — SODIUM CHLORIDE 0.9 % IV SOLN
500.0000 mg | Freq: Once | INTRAVENOUS | Status: AC
  Administered 2015-12-23: 500 mg via INTRAVENOUS
  Filled 2015-12-23: qty 10

## 2015-12-23 MED ORDER — SODIUM CHLORIDE 0.9 % IV SOLN
INTRAVENOUS | Status: DC
  Administered 2015-12-23: 09:00:00 via INTRAVENOUS

## 2015-12-23 MED ORDER — SODIUM CHLORIDE 0.9 % IV BOLUS (SEPSIS)
1000.0000 mL | Freq: Once | INTRAVENOUS | Status: AC
  Administered 2015-12-23: 1000 mL via INTRAVENOUS

## 2015-12-23 NOTE — ED Provider Notes (Signed)
CSN: [id number]     Arrival date & time 12/23/15  0509 History   First MD Initiated Contact with Patient 12/23/15 0540    Chief Complaint  Patient presents with  . Seizures     (Consider location/radiation/quality/duration/timing/severity/associated sxs/prior Treatment) HPI patient has a history of seizure disorder. She was last seen in the ED and November when she had run out of her Dilantin and she had a seizure. She does report she is now compliant with her medication. Her significant other states he was awakened at 2:15 with her having a seizure and it lasted until 2:52 AM. He states she was jerking all over and was tense and she was spitting and foaming at the mouth. He states afterwards she was confused for at least 30 minutes. She currently denies any pain but states her head feels tense. She has no prodrome or warning that she's going to have a seizure. She denies any change in her activity such as lack of sleep or drinking alcohol that would've increased her risk of having had a seizure. She denies taking any over-the-counter medications.  PCP Dr Karleen Hampshire at the [facility]  Past Medical History  Diagnosis Date  . Carpal tunnel syndrome   . Seizures [facility])    Past Surgical History  Procedure Laterality Date  . Tubal ligation     Family History  Problem Relation Age of Onset  . Hypertension Mother   . Hypertension Sister    Social History  Substance Use Topics  . Smoking status: Current Some Day Smoker -- 0.50 packs/day for 35 years    Types: Cigarettes  . Smokeless tobacco: Former Neurosurgeon  . Alcohol Use: No   OB History    Gravida Para Term Preterm AB TAB SAB Ectopic Multiple Living   Review of Systems  All other systems reviewed and are negative.     Allergies  Review of patient's allergies indicates no known allergies.  Home Medications   Prior to Admission medications   Medication Sig Start Date End Date Taking? Authorizing Provider   acetaminophen (TYLENOL) 500 MG tablet Take 500-1,000 mg by mouth 2 (two) times daily as needed for headache (Hand pain associated with carpal tunnel).    Historical Provider, MD  phenytoin (DILANTIN) 100 MG ER capsule 3 tablets at hs. 10/05/15   Sharman Cheek, MD   BP 133/88 mmHg  Pulse 117  Resp 18  SpO2 99%  LMP 05/29/2011 Physical Exam  Constitutional: She is oriented to person, place, and time. She appears well-developed and well-nourished.  Non-toxic appearance. She does not appear ill. No distress.  HENT:  Head: Normocephalic and atraumatic.  Right Ear: External ear normal.  Left Ear: External ear normal.  Nose: Nose normal. No mucosal edema or rhinorrhea.  Mouth/Throat: Oropharynx is clear and moist and mucous membranes are normal. No dental abscesses or uvula swelling.  No trauma to the tongue  Eyes: Conjunctivae and EOM are normal. Pupils are equal, round, and reactive to light.  Neck: Normal range of motion and full passive range of motion without pain. Neck supple.  Cardiovascular: Normal rate, regular rhythm and normal heart sounds.  Exam reveals no gallop and no friction rub.   No murmur heard. Pulmonary/Chest: Effort normal and breath sounds normal. No respiratory distress. She has no wheezes. She has no rhonchi. She has no rales. She exhibits no tenderness and no crepitus.  Abdominal: Soft. Normal appearance  and bowel sounds are normal. She exhibits no distension. There is no tenderness. There is no rebound and no guarding.  Musculoskeletal: Normal range of motion. She exhibits no edema or tenderness.  Moves all extremities well.   Neurological: She is alert and oriented to person, place, and time. She has normal strength. No cranial nerve deficit.  Skin: Skin is warm, dry and intact. No rash noted. No erythema. No pallor.  Psychiatric: She has a normal mood and affect. Her speech is normal and behavior is normal. Her mood appears not anxious.  Nursing note and vitals  reviewed.   ED Course  Procedures (including critical care time)  Medications  0.9 %  sodium chloride infusion (not administered)  phenytoin (DILANTIN) 500 mg in sodium chloride 0.9 % 100 mL IVPB (not administered)  LORazepam (ATIVAN) injection 1 mg (1 mg Intravenous Given 12/23/15 0600)  sodium chloride 0.9 % bolus 1,000 mL (1,000 mLs Intravenous New Bag/Given 12/23/15 0600)   Patient had IV started and she was given Ativan 1 mg IV.  Recheck at 7:10 AM patient is resting comfortably. We are waiting for her lab work to return.  After reviewing patient's labs at 7:50 AM she was given Dilantin 500 mg IV. She is to call her doctor's office Monday to discuss her dosing of her Dilantin which may need to be increased if she is indeed taking her medication as she has been instructed.  Labs Review Results for orders placed or performed during the hospital encounter of 12/23/15  Comprehensive metabolic panel  Result Value Ref Range   Sodium 137 135 - 145 mmol/L   Potassium 4.2 3.5 - 5.1 mmol/L   Chloride 104 101 - 111 mmol/L   CO2 25 22 - 32 mmol/L   Glucose, Bld 84 65 - 99 mg/dL   BUN 22 (H) 6 - 20 mg/dL   Creatinine, Ser 4.09 0.44 - 1.00 mg/dL   Calcium 9.0 8.9 - 81.1 mg/dL   Total Protein 7.5 6.5 - 8.1 g/dL   Albumin 3.9 3.5 - 5.0 g/dL   AST 19 15 - 41 U/L   ALT 17 14 - 54 U/L   Alkaline Phosphatase 105 38 - 126 U/L   Total Bilirubin 0.4 0.3 - 1.2 mg/dL   GFR calc non Af Amer >60 >60 mL/min   GFR calc Af Amer >60 >60 mL/min   Anion gap 8 5 - 15  Ethanol  Result Value Ref Range   Alcohol, Ethyl (B) 8 (H) <5 mg/dL  CBC with Differential  Result Value Ref Range   WBC 15.6 (H) 4.0 - 10.5 K/uL   RBC 4.69 3.87 - 5.11 MIL/uL   Hemoglobin 13.2 12.0 - 15.0 g/dL   HCT 91.4 78.2 - 95.6 %   MCV 84.9 78.0 - 100.0 fL   MCH 28.1 26.0 - 34.0 pg   MCHC 33.2 30.0 - 36.0 g/dL   RDW 21.3 08.6 - 57.8 %   Platelets 299 150 - 400 K/uL   Neutrophils Relative % 90 %   Neutro Abs 14.0 (H) 1.7 -  7.7 K/uL   Lymphocytes Relative 5 %   Lymphs Abs 0.8 0.7 - 4.0 K/uL   Monocytes Relative 5 %   Monocytes Absolute 0.7 0.1 - 1.0 K/uL   Eosinophils Relative 0 %   Eosinophils Absolute 0.0 0.0 - 0.7 K/uL   Basophils Relative 0 %   Basophils Absolute 0.0 0.0 - 0.1 K/uL  Phenytoin level, total  Result Value Ref Range  Phenytoin Lvl 8.6 (L) 10.0 - 20.0 ug/mL   Laboratory interpretation all normal except leukocytosis, and subtherapeutic Dilantin level     Imaging Review No results found. I have personally reviewed and evaluated these images and lab results as part of my medical decision-making.   EKG Interpretation None      ED ECG REPORT   Date: 12/23/2015  Rate: 105  Rhythm: sinus tachycardia  QRS Axis: normal  Intervals: normal  ST/T Wave abnormalities: normal  Conduction Disutrbances:none  Narrative Interpretation:   Old EKG Reviewed: none available  I have personally reviewed the EKG tracing and agree with the computerized printout as noted.   MDM   Final diagnoses:  Seizure (HCC)  Subtherapeutic phenytoin level   Plan discharge  Devoria Albe, MD, Concha Pyo, MD 12/23/15 [id number]

## 2015-12-23 NOTE — Discharge Instructions (Signed)
Your Dilantin level was low tonight at 8.6, 15-20 would be therapeutic. Please call your doctor's office tomorrow, Monday to discuss your Dilantin, you may need to have your dosage increased. You were given extra Dilantin in your IV this morning.   Seizure, Adult A seizure means there is unusual activity in the brain. A seizure can cause changes in attention or behavior. Seizures often cause shaking (convulsions). Seizures often last from 30 seconds to 2 minutes. HOME CARE   If you are given medicines, take them exactly as told by your doctor.  Keep all doctor visits as told.  Do not swim or drive until your doctor says it is okay.  Teach others what to do if you have a seizure. They should:  Lay you on the ground.  Put a cushion under your head.  Loosen any tight clothing around your neck.  Turn you on your side.  Stay with you until you get better. GET HELP RIGHT AWAY IF:   The seizure lasts longer than 2 to 5 minutes.  The seizure is very bad.  The person does not wake up after the seizure.  The person's attention or behavior changes. Drive the person to the emergency room or call your local emergency services (911 in [place] MAKE SURE YOU:   Understand these instructions.  Will watch your condition.  Will get help right away if you are not doing well or get worse.   This information is not intended to replace advice given to you by your health care provider. Make sure you discuss any questions you have with your health care provider.   Document Released: 04/15/2008 Document Revised: 01/20/2012 Document Reviewed: 06/09/2013 Elsevier Interactive Patient Education [employer]

## 2015-12-23 NOTE — ED Notes (Signed)
MD at bedside. 

## 2015-12-23 NOTE — ED Notes (Signed)
Pt's husband states pt had a seizure that started at 0215 and stated it lasted until 0350; states that she was unresponsive during this time and did not start speaking until 0352. Pt states she is having a headache and some dizziness. Pt has a hx of seizures.

## 2016-02-12 ENCOUNTER — Ambulatory Visit: Payer: Self-pay | Admitting: Physician Assistant

## 2016-02-14 ENCOUNTER — Encounter: Payer: Self-pay | Admitting: Physician Assistant

## 2016-02-15 ENCOUNTER — Ambulatory Visit: Payer: Self-pay | Admitting: Physician Assistant

## 2016-03-20 ENCOUNTER — Ambulatory Visit
Admission: RE | Admit: 2016-03-20 | Discharge: 2016-03-20 | Disposition: A | Discharge status: Home / Self Care | Payer: Self-pay | Source: Ambulatory Visit | Attending: Oncology | Admitting: Oncology

## 2016-03-20 ENCOUNTER — Encounter: Payer: Self-pay | Admitting: *Deleted

## 2016-03-20 ENCOUNTER — Other Ambulatory Visit: Payer: Self-pay | Admitting: Oncology

## 2016-03-20 ENCOUNTER — Ambulatory Visit: Payer: Self-pay | Attending: Oncology | Admitting: *Deleted

## 2016-03-20 VITALS — BP 149/105 | HR 71 | Temp 97.2°F | Ht 67.72 in | Wt 177.5 lb

## 2016-03-20 DIAGNOSIS — Z0000 Encounter for general adult medical examination without abnormal findings: Secondary | ICD-10-CM

## 2016-03-20 NOTE — Progress Notes (Signed)
Subjective:     Patient ID: Cheryl Peters, female   DOB: 05/06/1966, 50 y.o.   MRN: [id number]  HPI   Review of Systems     Objective:   Physical Exam  Pulmonary/Chest: Right breast exhibits no inverted nipple, no mass, no nipple discharge, no skin change and no tenderness. Left breast exhibits no inverted nipple, no mass, no nipple discharge, no skin change and no tenderness. Breasts are symmetrical.    Abdominal: There is no splenomegaly or hepatomegaly.    Genitourinary: No labial fusion. There is no rash, tenderness, lesion or injury on the right labia. There is no rash, tenderness, lesion or injury on the left labia. Cervix exhibits no motion tenderness, no discharge and no friability. Right adnexum displays no mass, no tenderness and no fullness. Left adnexum displays no mass, no tenderness and no fullness. No erythema or tenderness in the vagina. No foreign body around the vagina. No signs of injury around the vagina. No vaginal discharge found.       Assessment:     50 year [facility] Black [facility] smear and [facility].  Clinical breast exam unremarkable.  Taught self breast awareness.  Specimen collected for pap smear without difficulty.  Blood pressure elevated at 149/105.  She is to recheck her blood pressure at [facility] or [facility], and if remains higher than 140/90 she is to follow-up with her primary care provider.  Hand out on hypertention given to patient.    Plan:     Screening mammogram ordered.  Specimen for pap smear sent to the lab.  Will follow-up per [facility] protocol.

## 2016-03-20 NOTE — Patient Instructions (Signed)
Human Papillomavirus Human papillomavirus (HPV) is the most common sexually transmitted infection (STI) and is highly contagious. HPV infections cause genital warts and cancers to the outlet of the womb (cervix), birth canal (vagina), opening of the birth canal (vulva), and anus. There are over 100 types of HPV. Unless wartlike lesions are present in the throat or there are genital warts that you can see or feel, HPV usually does not cause symptoms. It is possible to be infected for long periods and pass it on to others without knowing it. CAUSES  HPV is spread from person to person through sexual contact. This includes oral, vaginal, or anal sex. RISK FACTORS  Having unprotected sex. HPV can be spread by oral, vaginal, or anal sex.  Having several sex partners.  Having a sex partner who has other sex partners.  Having or having had another sexually transmitted infection. SIGNS AND SYMPTOMS  Most people carrying HPV do not have any symptoms. If symptoms are present, symptoms may include:  Wartlike lesions in the throat (from having oral sex).  Warts in the infected skin or mucous membranes.  Genital warts that may itch, burn, or bleed.  Genital warts that may be painful or bleed during sexual intercourse. DIAGNOSIS  If wartlike lesions are present in the throat or genital warts are present, your health care provider can usually diagnose HPV by physical examination.   Genital warts are easily seen with the naked eye.  Currently, there is no FDA-approved test to detect HPV in males.  In females, a Pap test can show cells that are infected with HPV.  In females, a scope can be used to view the cervix (colposcopy). A colposcopy can be performed if the pelvic exam or Pap test is abnormal. A sample of tissue may be removed (biopsy) during the colposcopy. TREATMENT  There is no treatment for the virus itself. However, there are treatments for the health problems and symptoms HPV can cause.  Your health care provider will follow you closely after you are treated. This is because the HPV can come back and may need treatment again. Treatment of HPV may include:   Medicines, which may be injected or applied in a cream, lotion, or gel form.  Use of a probe to apply extreme cold (cryotherapy).  Application of an intense beam of light (laser treatment).  Use of a probe to apply extreme heat (electrocautery).  Surgery. HOME CARE INSTRUCTIONS   Take medicines only as directed by your health care provider.  Use over-the-counter creams for itching or irritation as directed by your health care provider.  Keep all follow-up visits as directed by your health care provider. This is important.  Do not touch or scratch the warts.  Do not treat genital warts with medicines used for treating hand warts.  Do not have sex while you are being treated.  Do not douche or use tampons during treatment of HPV.  Tell your sex partner about your infection because he or she may also need treatment.  If you become pregnant, tell your health care provider that you have had HPV. Your health care provider will monitor you closely during pregnancy to be sure your baby is safe.  After treatment, use condoms during sex to prevent future infections.  Have only one sex partner.  Have a sex partner who does not have other sex partners. PREVENTION   Talk to your health care provider about getting the HPV vaccines. These vaccines prevent some HPV infections and cancers.   It is recommended that the vaccine be given to males and females between the ages of 9 and 26 years old. It will not work if you already have HPV, and it is not recommended for pregnant women.  A Pap test is done to screen for cervical cancer in women.  The first Pap test should be done at age 21 years.  Between ages 21 and 29 years, Pap tests are repeated every 2 years.  Beginning at age 30, you are advised to have a Pap test every  3 years as long as your past 3 Pap tests have been normal.  Some women have medical problems that increase the chance of getting cervical cancer. Talk to your health care provider about these problems. It is especially important to talk to your health care provider if a new problem develops soon after your last Pap test. In these cases, your health care provider may recommend more frequent screening and Pap tests.  The above recommendations are the same for women who have or have not gotten the vaccine for HPV.  If you had a hysterectomy for a problem that was not a cancer or a condition that could lead to cancer, then you no longer need Pap tests. However, even if you no longer need a Pap test, a regular exam is a good idea to make sure no other problems are starting.   If you are between the ages of 65 and 70 years and you have had normal Pap tests going back 10 years, you no longer need Pap tests. However, even if you no longer need a Pap test, a regular exam is a good idea to make sure no other problems are starting.  If you have had past treatment for cervical cancer or a condition that could lead to cancer, you need Pap tests and screening for cancer for at least 20 years after your treatment.  If Pap tests have been discontinued, risk factors (such as a new sexual partner)need to be reassessed to determine if screening should be resumed.  Some women may need screenings more often if they are at high risk for cervical cancer. SEEK MEDICAL CARE IF:   The treated skin becomes red, swollen, or painful.  You have a fever.  You feel generally ill.  You feel lumps or pimple-like projections in and around your genital area.  You develop bleeding of the vagina or the treatment area.  You have painful sexual intercourse. MAKE SURE YOU:   Understand these instructions.  Will watch your condition.  Will get help if you are not doing well or get worse.   This information is not  intended to replace advice given to you by your health care provider. Make sure you discuss any questions you have with your health care provider.   Document Released: 01/18/2004 Document Revised: 11/18/2014 Document Reviewed: 02/02/2014 Elsevier Interactive Patient Education 2016 Elsevier Inc.   Gave patient hand-out, Women Staying Healthy, Active and Well from [facility], with education on breast health, pap smears, heart and colon health. 

## 2016-03-25 LAB — PAP LB AND HPV HIGH-RISK
HPV, HIGH-RISK: NEGATIVE
PAP Smear Comment: 0

## 2016-04-12 ENCOUNTER — Encounter: Payer: Self-pay | Admitting: *Deleted

## 2016-04-12 NOTE — Progress Notes (Signed)
Called and informed patient of her pap and mammogram results.  Informed of trichomonas, and need to treat. Denies vaginal itching or burning.  Will not need to treat the Candida at this time.  Per patient request the pap results were faxed to Martie RoundNicole Spencer, NP at [facility].  Metronidazole 2 grams po once called into to [facility] per protocol.  Patient was encouraged not to drink any alcohol while taking this medication.  States she does not drink.  Encouraged to return in one year for annual screening mammogram and physical exam.  [facility] to [place].

## 2016-10-20 IMAGING — CT CT HEAD W/O CM
1 series · 16 of 30 positions shown, 20 images · non-contrast
Comparison: 12/18/2014

CLINICAL DATA: Seizure-like activity this morning. No significant
past mental history.

EXAM:
CT HEAD WITHOUT CONTRAST
TECHNIQUE: Contiguous axial images were obtained from the base of the skull
through the vertex without intravenous contrast.

[Series 2: headseq 4.8 h37s · axial · 0.43mm/px · z∈[+118,+270]mm · 16 of 36 slices shown, 20 images]
[im 2/36  brain]
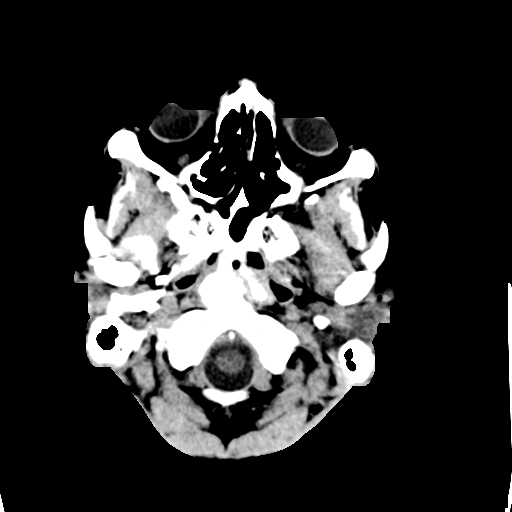
[im 2/36  bone]
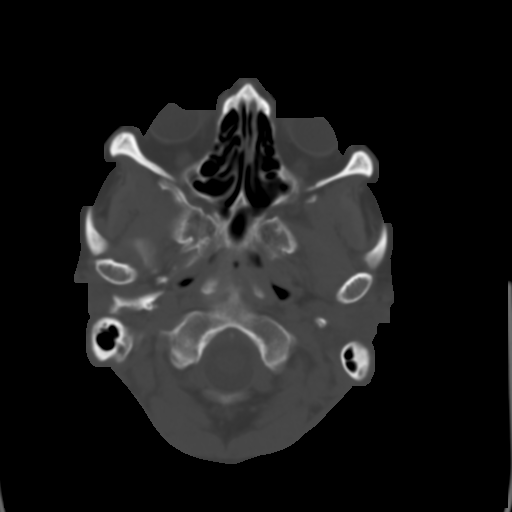
[im 4/36  brain]
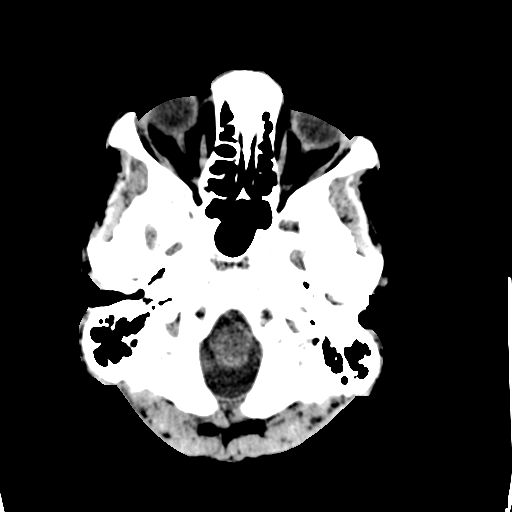
[im 7/36  brain]
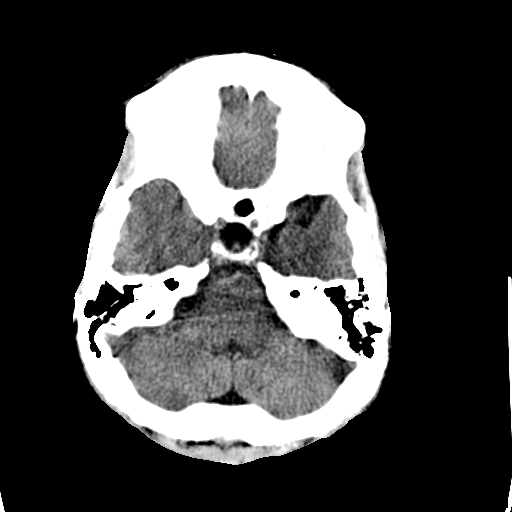
[im 9/36  brain]
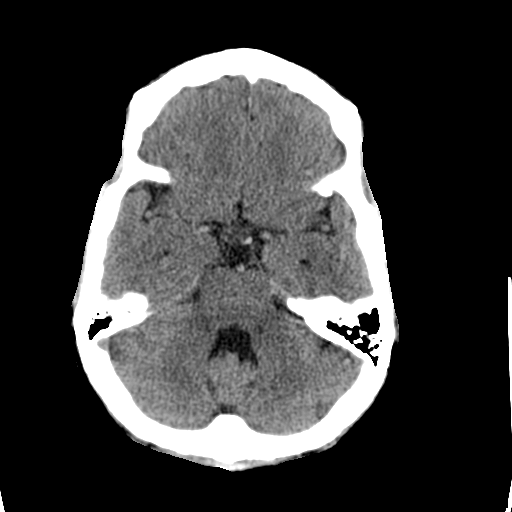
[im 10/36  brain]
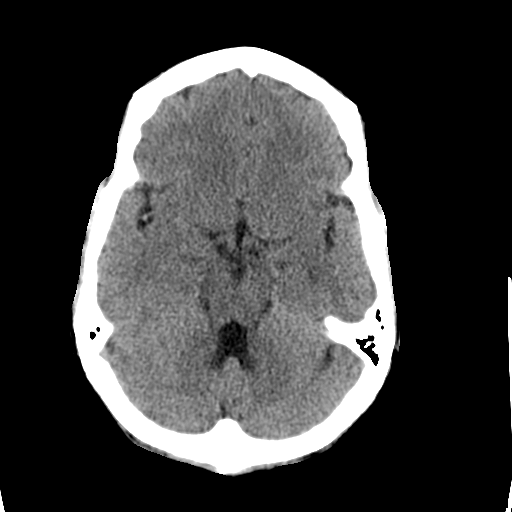
[im 10/36  bone]
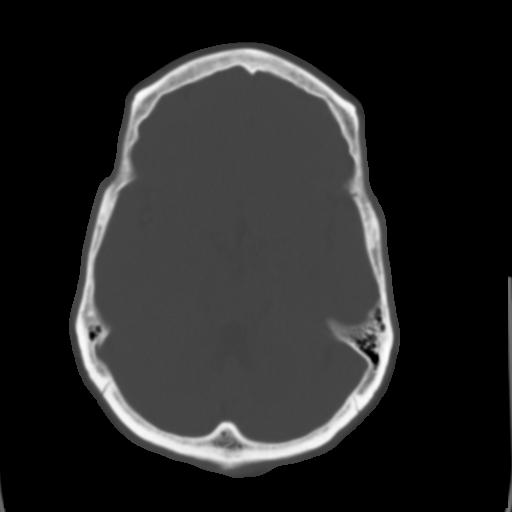
[im 13/36  brain]
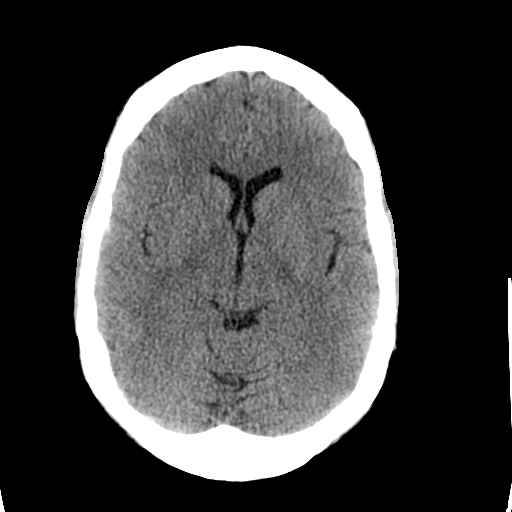
[im 15/36  brain]
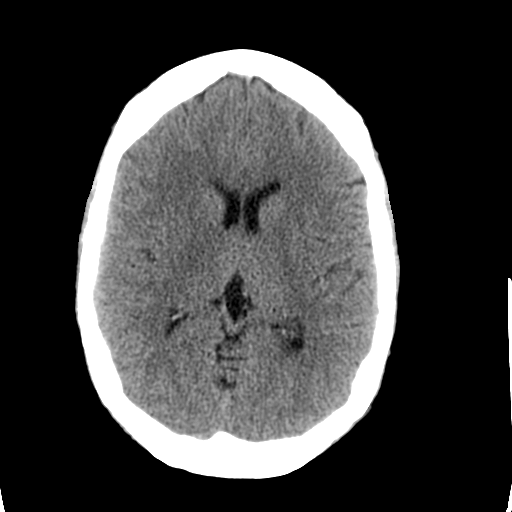
[im 17/36  brain]
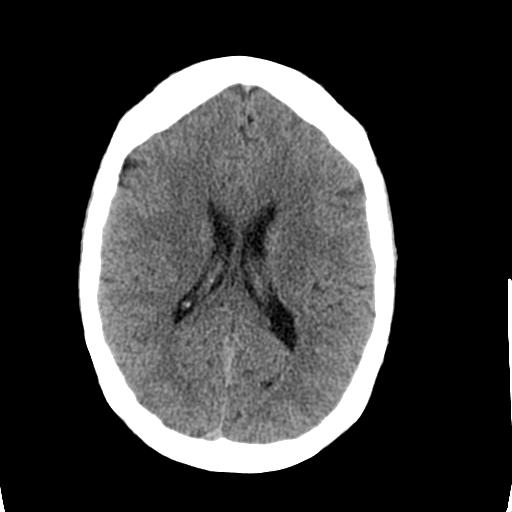
[im 19/36  brain]
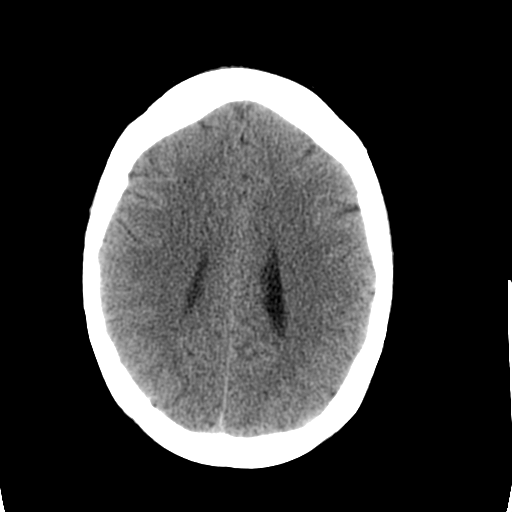
[im 19/36  bone]
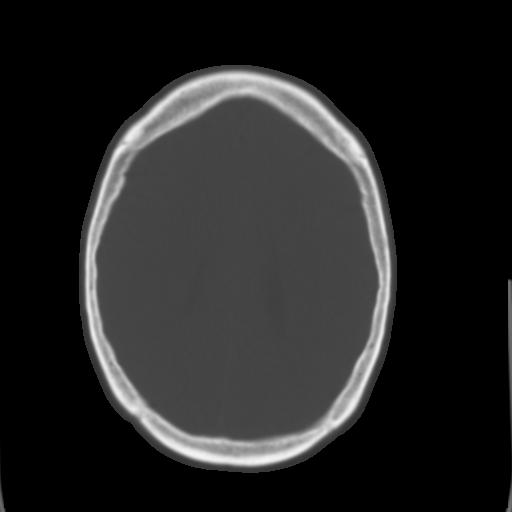
[im 21/36  brain]
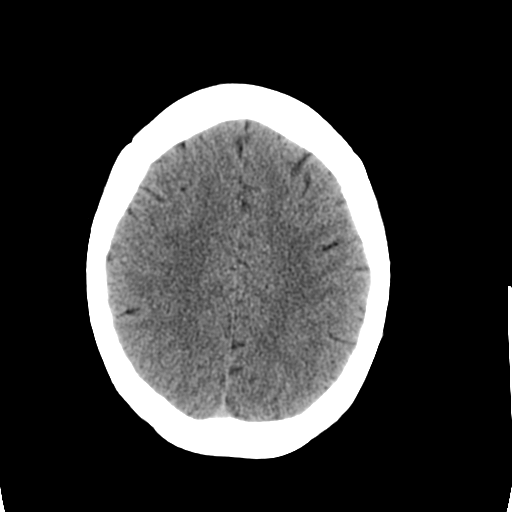
[im 23/36  brain]
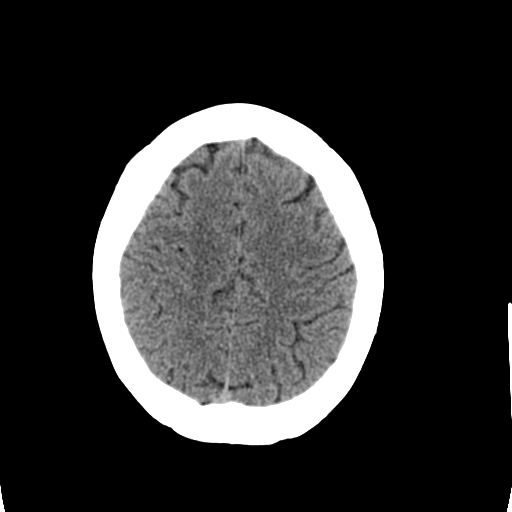
[im 26/36  brain]
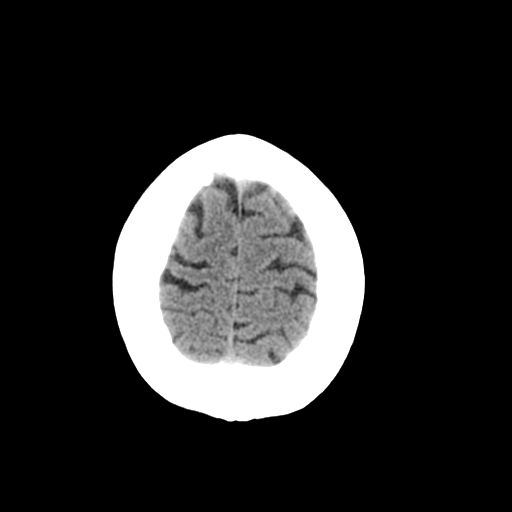
[im 27/36  brain]
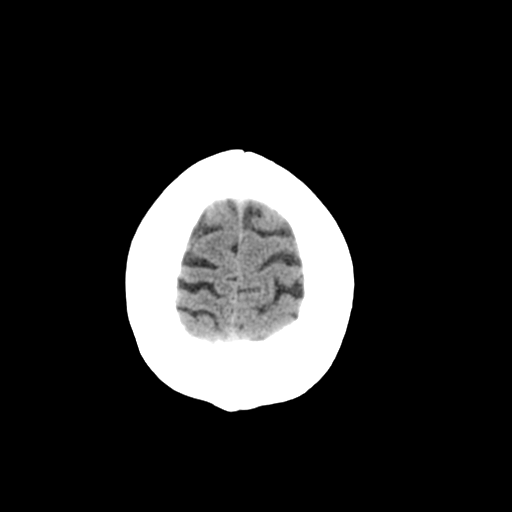
[im 27/36  bone]
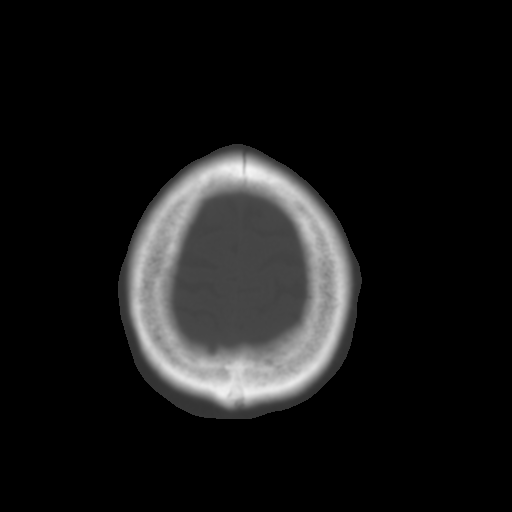
[im 29/36  brain]
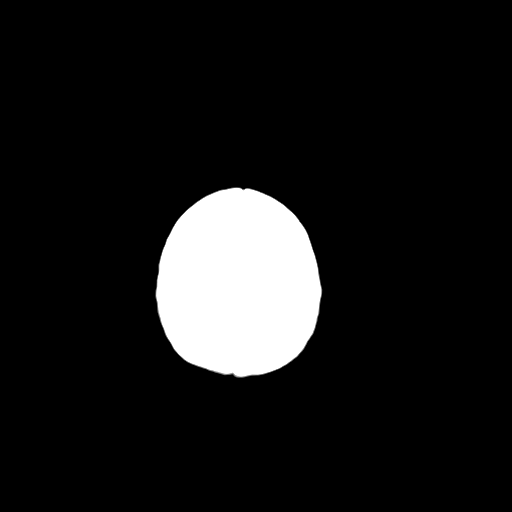
[im 32/36  brain]
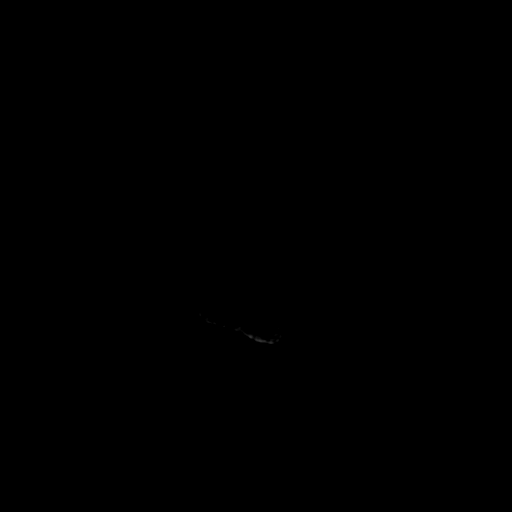
[im 34/36  brain]
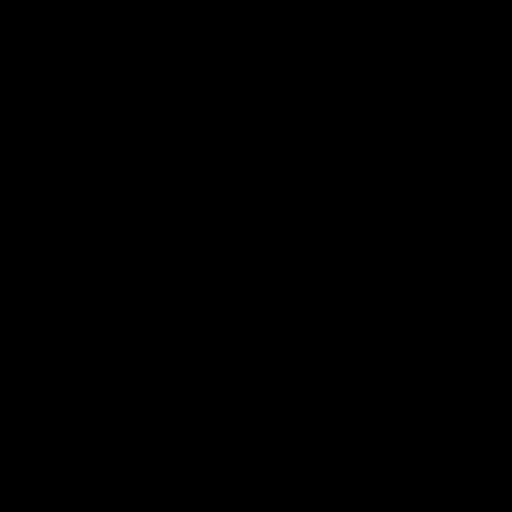

[16 of 30 positions shown; findings below may reference images not displayed]

FINDINGS: Gray-white differentiation is maintained. No CT evidence of acute
large territory infarct. No intraparenchymal or extra-axial mass or
hemorrhage. Normal size and configuration of the ventricles and
basilar cisterns. No midline shift. There is underpneumatization of
the bilateral frontal sinuses. The remaining paranasal sinuses and
mastoid air cells are normally aerated. No air-fluid levels.
Regional soft tissues appear normal. No displaced calvarial
fracture.
IMPRESSION: Negative noncontrast head CT.

## 2018-03-09 ENCOUNTER — Ambulatory Visit: Payer: Self-pay | Attending: Oncology

## 2018-03-09 VITALS — Ht 67.0 in | Wt 168.0 lb

## 2018-03-09 DIAGNOSIS — Z0000 Encounter for general adult medical examination without abnormal findings: Secondary | ICD-10-CM

## 2018-03-09 NOTE — Progress Notes (Signed)
  Subjective:     Patient ID: Cheryl Peters, female   DOB: January 29, 1966, 52 y.o.   MRN: [id number]  HPI   Review of Systems     Objective:   Physical Exam  Pulmonary/Chest: Right breast exhibits no inverted nipple, no mass, no nipple discharge, no skin change and no tenderness. Left breast exhibits no inverted nipple, no mass, no nipple discharge, no skin change and no tenderness. Breasts are symmetrical.       Assessment:     52 year old patient presents for [facility] clinic visit.  Patient screened, and meets BCCCP eligibility.  Patient does not have insurance, Medicare or Medicaid.  Handout given on Affordable Care Act.  Instructed patient on breast self awareness using teach back method.  Clinical breast exam unremarkable.  No mass or lump palpated.  Patient takes dilantin for seizures.    Plan:     Sent for bilateral screening mammogram.

## 2018-05-12 ENCOUNTER — Ambulatory Visit
Admission: RE | Admit: 2018-05-12 | Discharge: 2018-05-12 | Disposition: A | Discharge status: Home / Self Care | Payer: Medicaid Other | Source: Ambulatory Visit | Attending: Oncology | Admitting: Oncology

## 2018-05-12 DIAGNOSIS — Z0000 Encounter for general adult medical examination without abnormal findings: Secondary | ICD-10-CM

## 2018-05-13 NOTE — Progress Notes (Signed)
Letter mailed from [facility] to notify of normal mammogram results.  Patient to return in one year for annual screening.  Copy to [facility]. 

## 2018-07-01 NOTE — Congregational Nurse Program (Signed)
Congregational Nurse Program Note  Date of Encounter: 07/01/2018  Past Medical History: Past Medical History:  Diagnosis Date  . Carpal tunnel syndrome   . Seizures [facility](HCC)     Encounter Details: CNP Questionnaire - 07/01/18 0952      Questionnaire   Patient Status  Not Applicable    Race  Black or African American    [facility] Patient S[facility] At  [employer] [place]    Insurance  Medicaid    Uninsured  Not Applicable    Food  No food insecurities    Housing/Utilities  Yes, have permanent housing    Transportation  No transportation needs    Interpersonal Safety  Yes, feel physically and emotionally safe where you currently live    Medication  No medication insecurities    Referrals  Not Applicable    ED Visit Averted  Not Applicable    Life-Saving Intervention Made  Not Applicable       B/P 124/84 P 102  Seen at the food pantry. No concerns Jenene SlickerEmma Valerye Kobus RN, [place] [facility] [phone]

## 2019-01-18 ENCOUNTER — Other Ambulatory Visit: Payer: Self-pay | Admitting: Family Medicine

## 2019-01-18 DIAGNOSIS — Z1231 Encounter for screening mammogram for malignant neoplasm of breast: Secondary | ICD-10-CM

## 2021-08-23 ENCOUNTER — Other Ambulatory Visit: Payer: Self-pay | Admitting: Family Medicine

## 2021-08-23 DIAGNOSIS — Z1231 Encounter for screening mammogram for malignant neoplasm of breast: Secondary | ICD-10-CM

## 2021-09-25 ENCOUNTER — Other Ambulatory Visit (HOSPITAL_COMMUNITY): Payer: Self-pay | Admitting: Family Medicine

## 2021-09-25 ENCOUNTER — Other Ambulatory Visit: Payer: Self-pay | Admitting: Family Medicine

## 2021-09-25 DIAGNOSIS — R1013 Epigastric pain: Secondary | ICD-10-CM

## 2021-09-25 DIAGNOSIS — R634 Abnormal weight loss: Secondary | ICD-10-CM

## 2023-10-29 ENCOUNTER — Other Ambulatory Visit: Payer: Self-pay | Admitting: Family Medicine

## 2023-10-29 DIAGNOSIS — Z1231 Encounter for screening mammogram for malignant neoplasm of breast: Secondary | ICD-10-CM

## 2023-11-24 ENCOUNTER — Ambulatory Visit
Admission: RE | Admit: 2023-11-24 | Discharge: 2023-11-24 | Disposition: A | Discharge status: Home / Self Care | Payer: Medicaid Other | Source: Ambulatory Visit | Attending: Family Medicine | Admitting: Family Medicine

## 2023-11-24 DIAGNOSIS — Z1231 Encounter for screening mammogram for malignant neoplasm of breast: Secondary | ICD-10-CM | POA: Insufficient documentation
